# Patient Record
Sex: Male | Born: 1960 | Race: White | Hispanic: No | Marital: Married | Smoking: Current every day smoker
Health system: Southern US, Community
[De-identification: ages and names within clinical notes are randomized; demographics above are authoritative.]

## PROBLEM LIST (undated history)

## (undated) DIAGNOSIS — K759 Inflammatory liver disease, unspecified: Secondary | ICD-10-CM

## (undated) DIAGNOSIS — F32A Depression, unspecified: Secondary | ICD-10-CM

## (undated) DIAGNOSIS — G4730 Sleep apnea, unspecified: Secondary | ICD-10-CM

## (undated) DIAGNOSIS — I10 Essential (primary) hypertension: Secondary | ICD-10-CM

## (undated) DIAGNOSIS — M1990 Unspecified osteoarthritis, unspecified site: Secondary | ICD-10-CM

## (undated) DIAGNOSIS — R918 Other nonspecific abnormal finding of lung field: Secondary | ICD-10-CM

## (undated) DIAGNOSIS — J449 Chronic obstructive pulmonary disease, unspecified: Secondary | ICD-10-CM

## (undated) DIAGNOSIS — B1920 Unspecified viral hepatitis C without hepatic coma: Secondary | ICD-10-CM

## (undated) HISTORY — PX: OTHER SURGICAL HISTORY: SHX169

## (undated) HISTORY — PX: CERVICAL SPINE SURGERY: SHX589

---

## 2003-08-27 ENCOUNTER — Ambulatory Visit (HOSPITAL_COMMUNITY): Admission: RE | Admit: 2003-08-27 | Discharge: 2003-08-28 | Payer: Self-pay | Admitting: Orthopaedic Surgery

## 2003-10-22 ENCOUNTER — Ambulatory Visit (HOSPITAL_COMMUNITY): Admission: RE | Admit: 2003-10-22 | Discharge: 2003-10-22 | Payer: Self-pay | Admitting: Specialist

## 2005-07-27 ENCOUNTER — Encounter: Admission: RE | Admit: 2005-07-27 | Discharge: 2005-07-27 | Payer: Self-pay | Admitting: Neurosurgery

## 2005-09-19 ENCOUNTER — Inpatient Hospital Stay (HOSPITAL_COMMUNITY): Admission: RE | Admit: 2005-09-19 | Discharge: 2005-09-21 | Payer: Self-pay | Admitting: Neurosurgery

## 2005-10-27 ENCOUNTER — Ambulatory Visit: Payer: Self-pay | Admitting: Oncology

## 2005-10-31 ENCOUNTER — Ambulatory Visit: Payer: Self-pay | Admitting: Critical Care Medicine

## 2005-10-31 ENCOUNTER — Inpatient Hospital Stay (HOSPITAL_COMMUNITY): Admission: AD | Admit: 2005-10-31 | Discharge: 2005-11-11 | Payer: Self-pay | Admitting: Thoracic Surgery

## 2005-11-01 ENCOUNTER — Ambulatory Visit: Payer: Self-pay | Admitting: Internal Medicine

## 2005-11-03 ENCOUNTER — Encounter (INDEPENDENT_AMBULATORY_CARE_PROVIDER_SITE_OTHER): Payer: Self-pay | Admitting: Specialist

## 2005-11-15 ENCOUNTER — Encounter: Admission: RE | Admit: 2005-11-15 | Discharge: 2005-11-15 | Payer: Self-pay | Admitting: Thoracic Surgery

## 2005-11-23 ENCOUNTER — Inpatient Hospital Stay (HOSPITAL_COMMUNITY): Admission: EM | Admit: 2005-11-23 | Discharge: 2005-11-27 | Payer: Self-pay | Admitting: Emergency Medicine

## 2005-11-29 ENCOUNTER — Inpatient Hospital Stay (HOSPITAL_COMMUNITY): Admission: EM | Admit: 2005-11-29 | Discharge: 2005-12-03 | Payer: Self-pay | Admitting: Emergency Medicine

## 2005-12-09 ENCOUNTER — Emergency Department (HOSPITAL_COMMUNITY): Admission: EM | Admit: 2005-12-09 | Discharge: 2005-12-09 | Payer: Self-pay | Admitting: Emergency Medicine

## 2005-12-26 ENCOUNTER — Encounter: Payer: Self-pay | Admitting: Internal Medicine

## 2005-12-28 ENCOUNTER — Ambulatory Visit: Payer: Self-pay | Admitting: Internal Medicine

## 2005-12-28 ENCOUNTER — Ambulatory Visit (HOSPITAL_COMMUNITY): Admission: RE | Admit: 2005-12-28 | Discharge: 2005-12-28 | Payer: Self-pay | Admitting: Internal Medicine

## 2005-12-28 LAB — CONVERTED CEMR LAB
ALT: 18 units/L (ref 0–53)
AST: 20 units/L (ref 0–37)
Alkaline Phosphatase: 68 units/L (ref 39–117)
Calcium: 9 mg/dL (ref 8.4–10.5)
Chloride: 106 meq/L (ref 96–112)
Platelets: 354 10*3/uL (ref 152–374)
Potassium: 4.1 meq/L (ref 3.5–5.3)
Sodium: 141 meq/L (ref 135–145)
WBC: 6.9 10*3/uL (ref 3.7–10.0)

## 2006-01-02 ENCOUNTER — Encounter: Admission: RE | Admit: 2006-01-02 | Discharge: 2006-01-02 | Payer: Self-pay | Admitting: Thoracic Surgery

## 2006-02-14 ENCOUNTER — Encounter: Admission: RE | Admit: 2006-02-14 | Discharge: 2006-02-14 | Payer: Self-pay | Admitting: Thoracic Surgery

## 2006-04-05 DIAGNOSIS — B957 Other staphylococcus as the cause of diseases classified elsewhere: Secondary | ICD-10-CM | POA: Insufficient documentation

## 2006-04-05 DIAGNOSIS — J869 Pyothorax without fistula: Secondary | ICD-10-CM | POA: Insufficient documentation

## 2006-04-05 DIAGNOSIS — Z8709 Personal history of other diseases of the respiratory system: Secondary | ICD-10-CM | POA: Insufficient documentation

## 2006-04-09 DIAGNOSIS — F329 Major depressive disorder, single episode, unspecified: Secondary | ICD-10-CM

## 2006-04-09 DIAGNOSIS — IMO0002 Reserved for concepts with insufficient information to code with codable children: Secondary | ICD-10-CM | POA: Insufficient documentation

## 2006-04-09 DIAGNOSIS — F3289 Other specified depressive episodes: Secondary | ICD-10-CM | POA: Insufficient documentation

## 2006-04-09 DIAGNOSIS — B1710 Acute hepatitis C without hepatic coma: Secondary | ICD-10-CM | POA: Insufficient documentation

## 2006-08-14 ENCOUNTER — Ambulatory Visit: Payer: Self-pay | Admitting: Thoracic Surgery

## 2007-01-01 ENCOUNTER — Ambulatory Visit (HOSPITAL_COMMUNITY): Admission: RE | Admit: 2007-01-01 | Discharge: 2007-01-01 | Payer: Self-pay | Admitting: Neurosurgery

## 2007-04-08 ENCOUNTER — Ambulatory Visit: Payer: Self-pay | Admitting: Thoracic Surgery

## 2007-04-08 ENCOUNTER — Ambulatory Visit (HOSPITAL_COMMUNITY): Admission: RE | Admit: 2007-04-08 | Discharge: 2007-04-08 | Payer: Self-pay | Admitting: Neurosurgery

## 2007-04-24 ENCOUNTER — Encounter: Admission: RE | Admit: 2007-04-24 | Discharge: 2007-04-24 | Payer: Self-pay | Admitting: Neurosurgery

## 2007-06-06 ENCOUNTER — Encounter: Admission: RE | Admit: 2007-06-06 | Discharge: 2007-06-06 | Payer: Self-pay | Admitting: Neurosurgery

## 2008-02-27 IMAGING — CR DG CHEST 2V
3 series · 3 of 3 positions shown · non-contrast
Comparison: Portable chest x-ray yesterday.

CLINICAL DATA: Followup left pleural effusion. Left chest tube in place. Severe
dyspnea.

CHEST - 2 VIEW  11/01/2005:

[w chest pa]
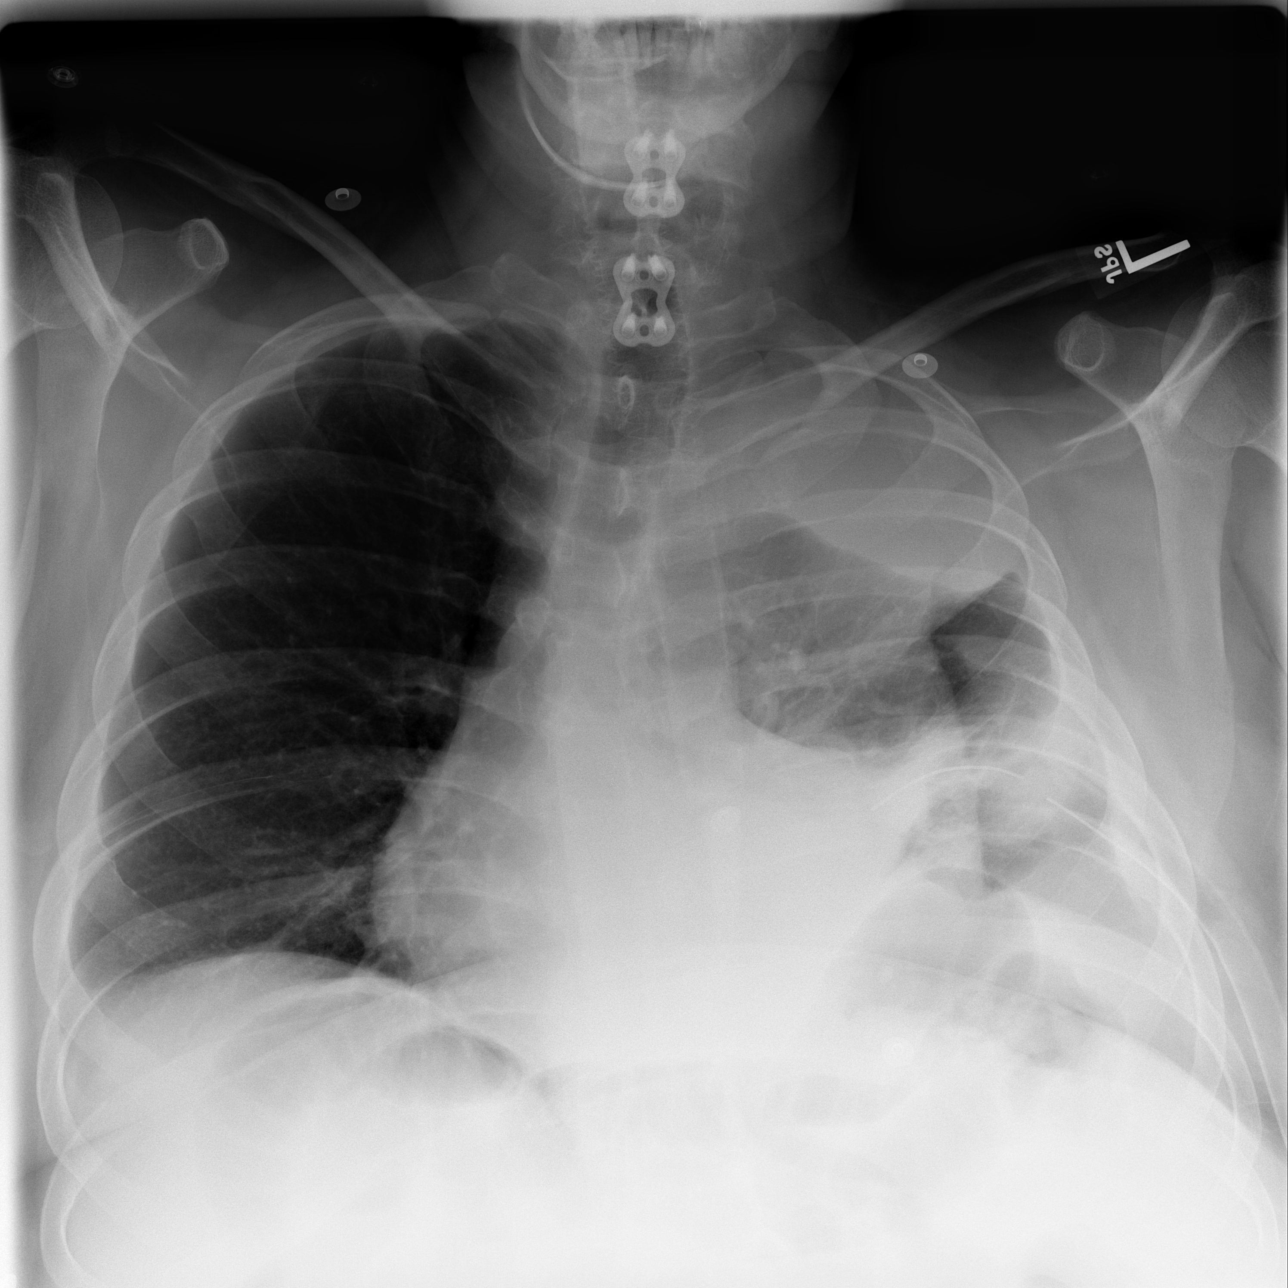

[w chest lat (1 of 2)]
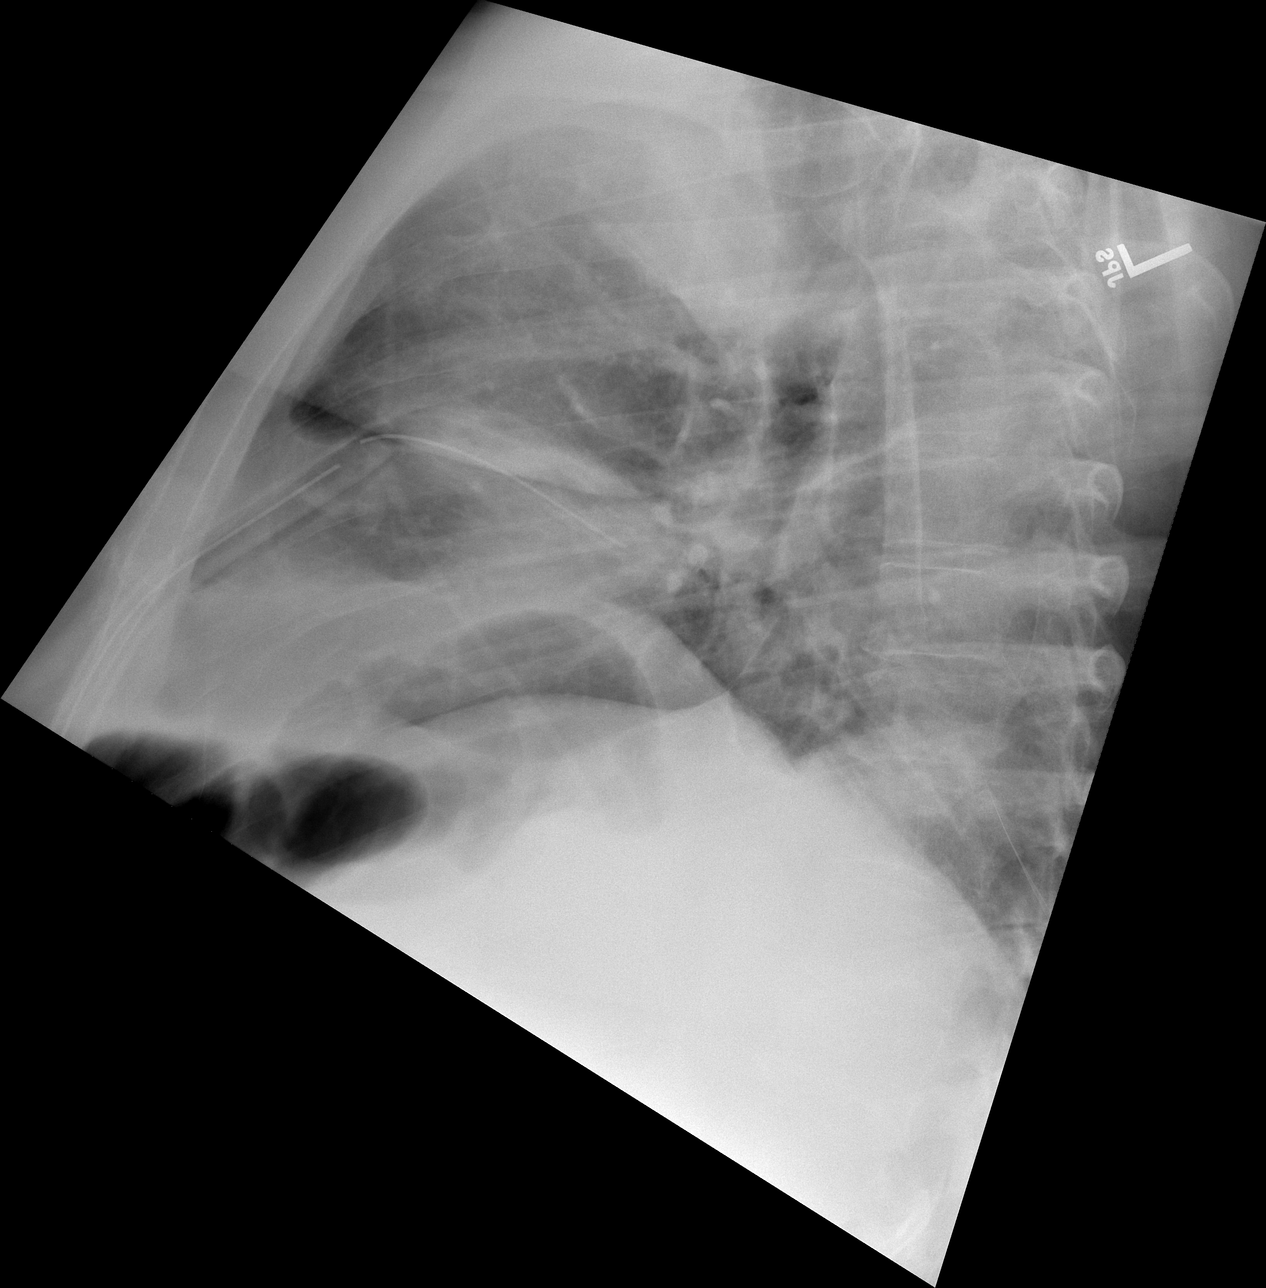

[w chest lat (2 of 2)]
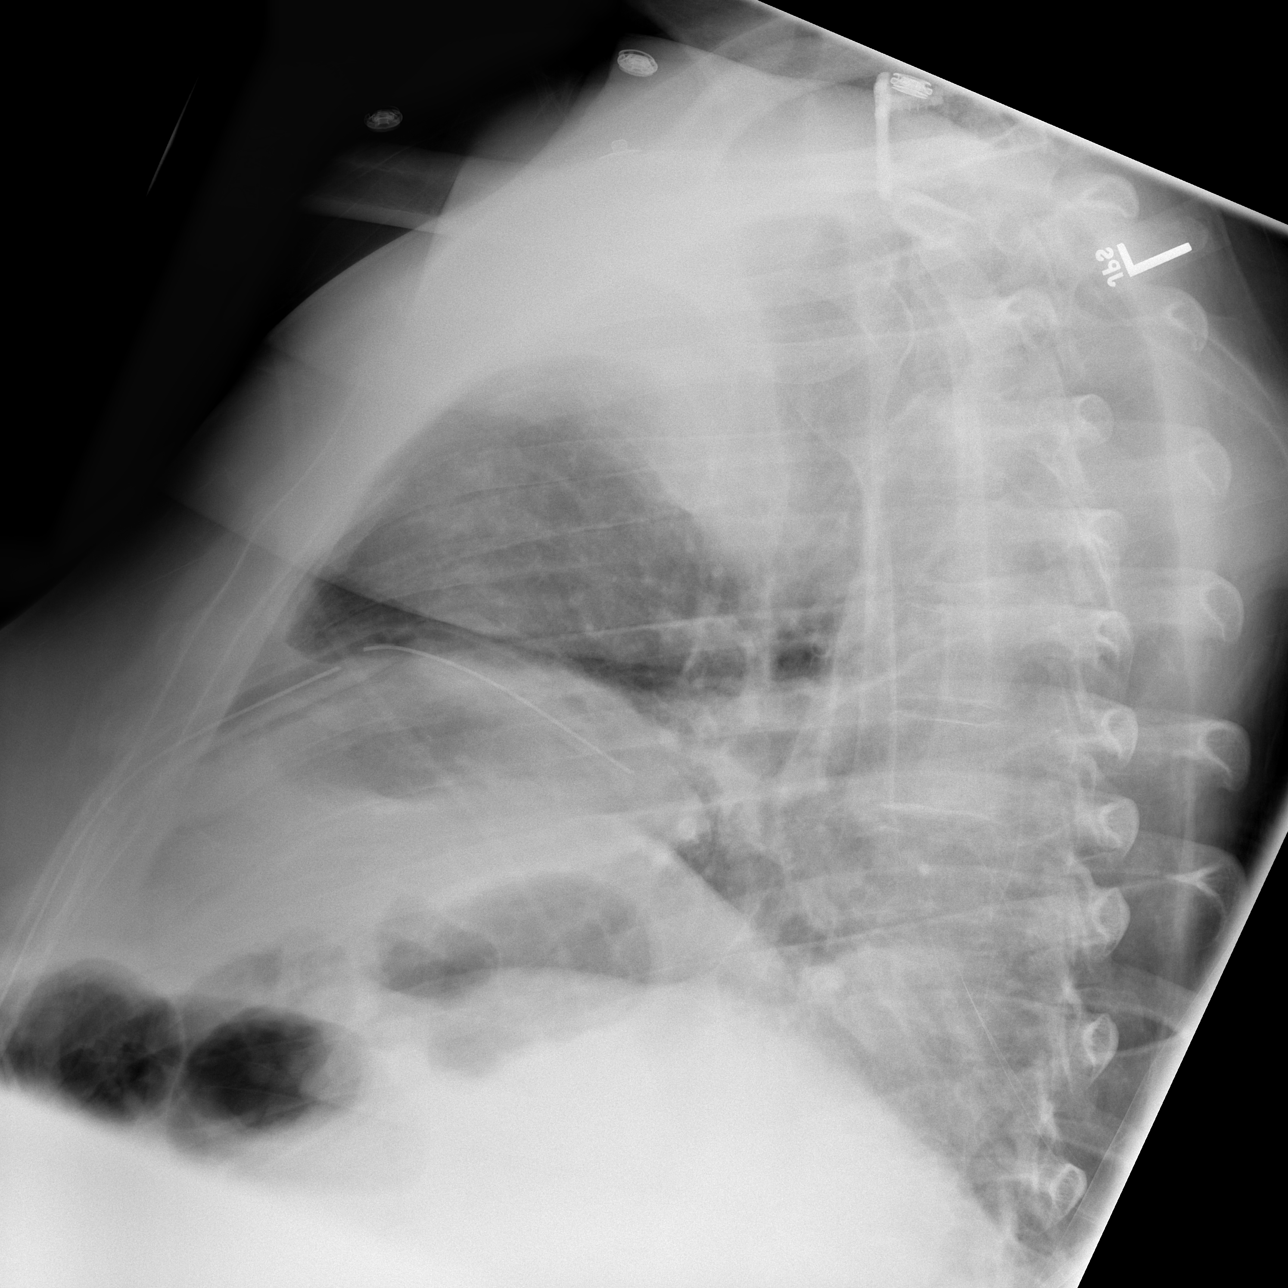

[3 of 3 positions shown; findings below may reference images not displayed]

FINDINGS: The left chest tube is in place in the anterior left chest. There is
no pneumothorax. There is a loculated left pleural effusion in the apex which is
unchanged. There is passive atelectasis and/or pneumonia in the left lung. The
right lung is essentially clear, with only mild atelectasis at the base. The
heart is shifted to the right due to the large left effusion. The heart size is
normal to slightly enlarged.
IMPRESSION: Left chest tube in place in the anterior left lower chest. Stable moderately
large loculated left apical pleural effusion. Stable passive atelectasis or
pneumonia in the left lung. Mild right basilar atelectasis. No new
abnormalities.

## 2008-03-03 IMAGING — CR DG CHEST 1V PORT
1 series · 1 of 1 positions shown · non-contrast
Comparison: 11/05/05.

CLINICAL DATA: Followup pleural effusion. 
 PORTABLE CHEST ? 1 VIEW ? 11/06/05 ? 5535:

[view not recorded]
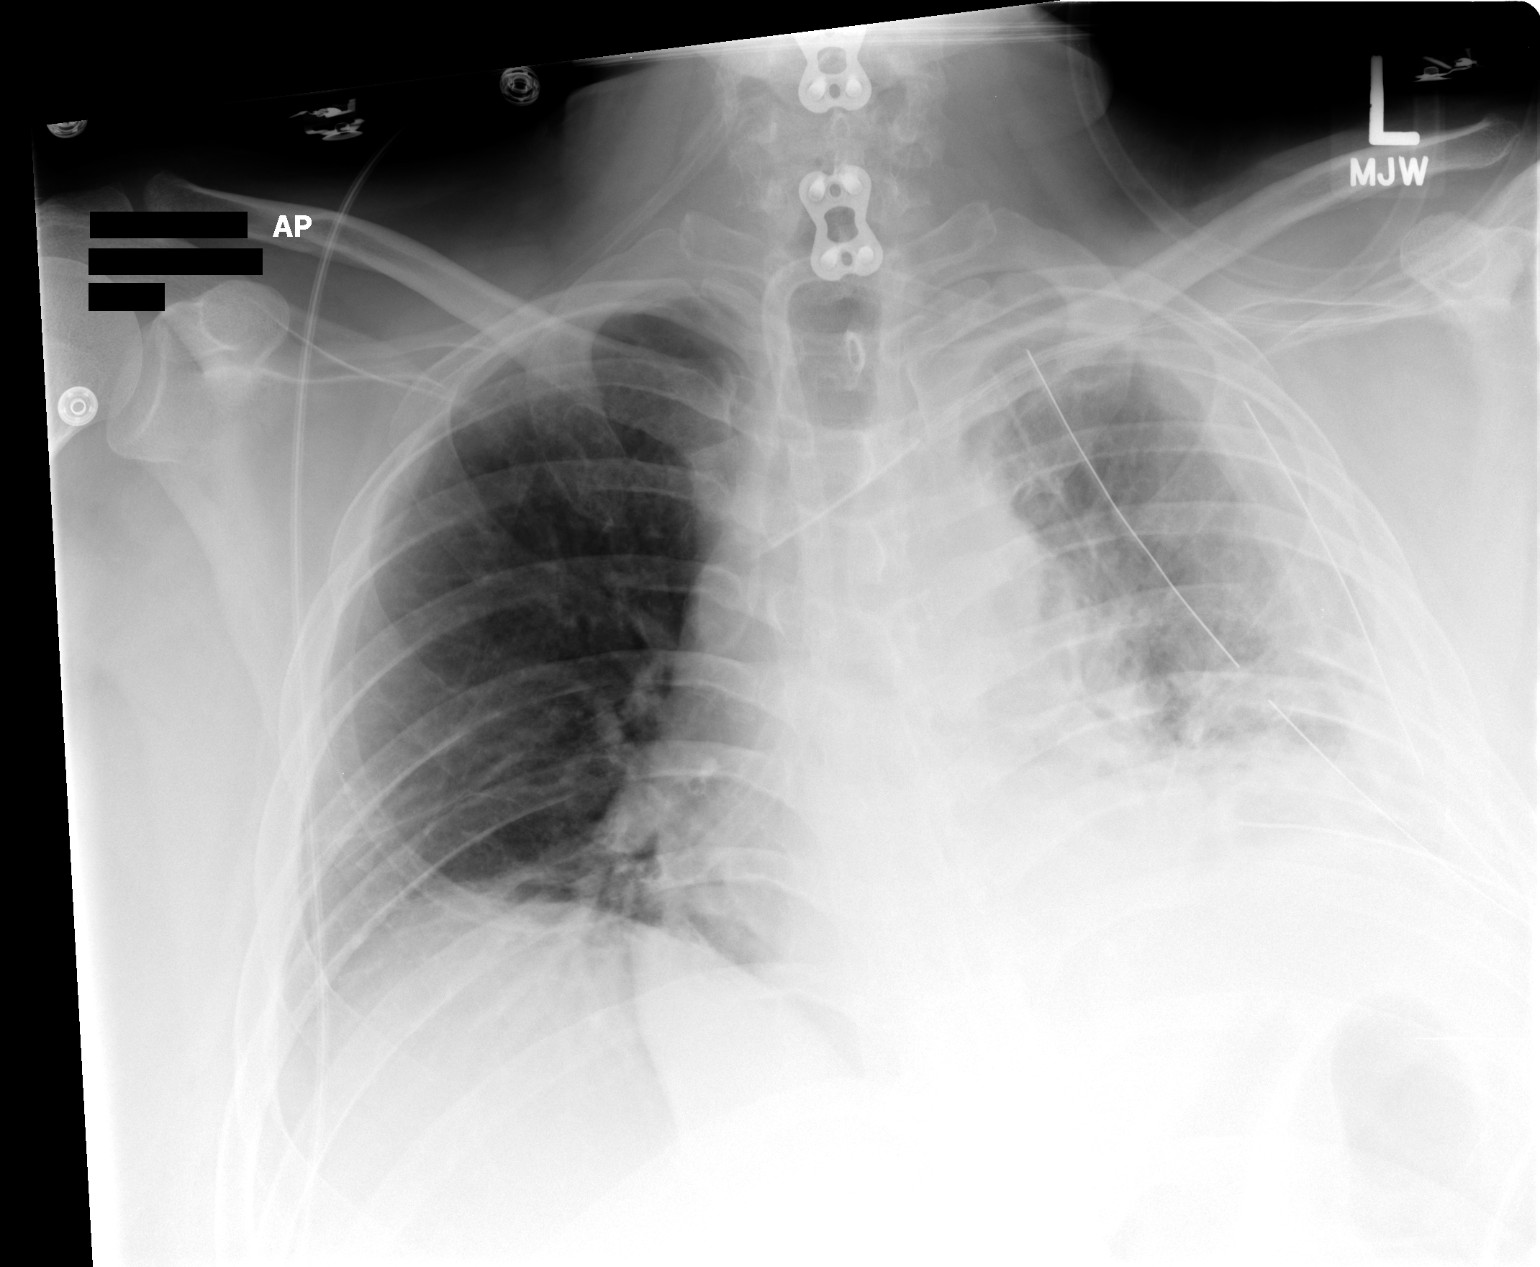

[1 of 1 positions shown; findings below may reference images not displayed]

FINDINGS: Left-sided chest tubes are unchanged in position.  There is stable pleural thickening/loculated left pleural fluid as well as stable bibasilar atelectasis.
IMPRESSION: No significant change in aeration.

## 2008-03-05 IMAGING — CR DG CHEST 1V PORT
1 series · 1 of 1 positions shown · non-contrast
Comparison: Earlier exam today at 8091 hours.

CLINICAL DATA: PICC line placement. 
 PORTABLE CHEST - 1 VIEW ? 11/08/05 (6050 HOURS):

[view not recorded]
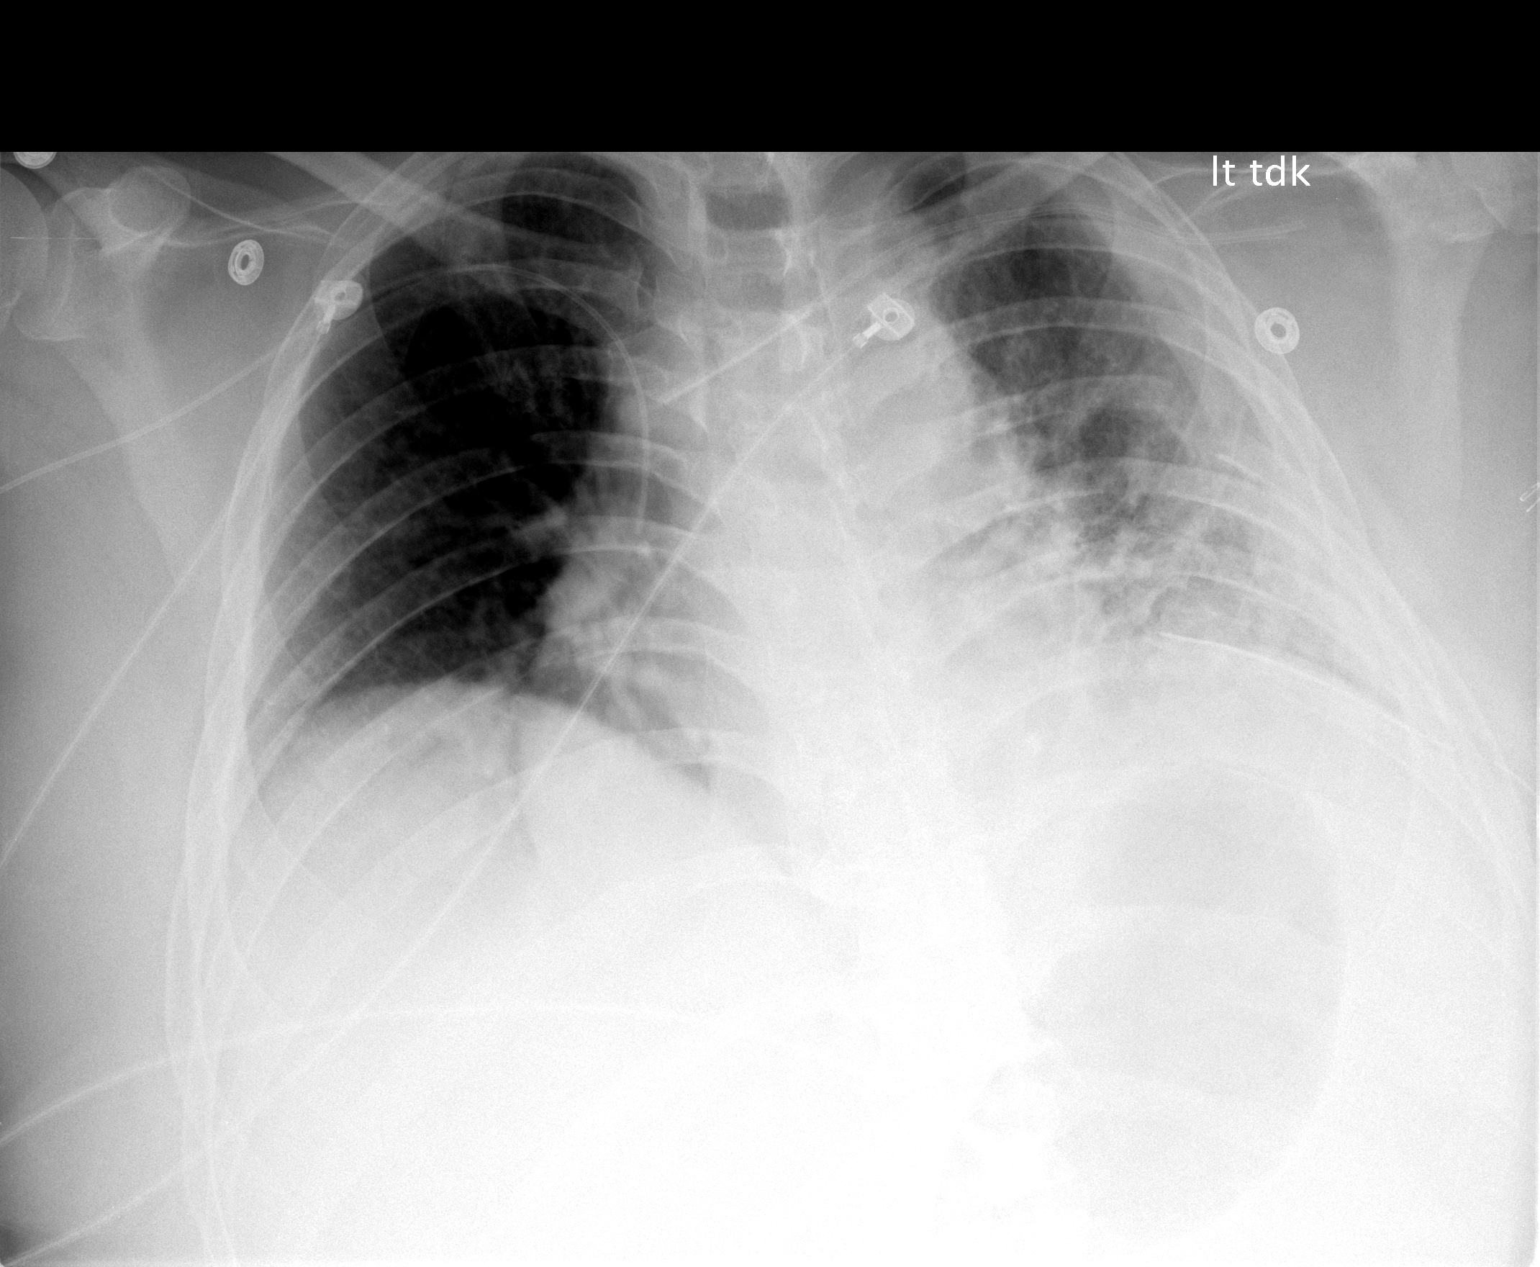

[1 of 1 positions shown; findings below may reference images not displayed]

FINDINGS: Specifically, PICC line has been inserted via right upper extremity approach.  The tip is in the SVC.  Preexisting left subclavian CVC tip is in the proximal SVC.   Minimal improved aeration in the left lung airspace disease, particularly left lower lung zone and left pleural effusion are again noted.  Left pleural chest tube unchanged in position.  No pneumothorax.
IMPRESSION: Specifically, the PICC line tip is in the SVC.  See comments above.

## 2010-02-20 ENCOUNTER — Encounter: Payer: Self-pay | Admitting: Neurosurgery

## 2010-06-14 NOTE — Op Note (Signed)
NAME:  [name], [name] NO.:  [id number]   MEDICAL RECORD NO.:  [id number]          PATIENT TYPE:  OIB   LOCATION:  3524                         FACILITY:  [facility]   PHYSICIAN:  Kathaleen Maser. Pool, M.D.    DATE OF BIRTH:  07-30-1960   DATE OF PROCEDURE:  04/08/2007  DATE OF DISCHARGE:  04/08/2007                               OPERATIVE REPORT   PREOPERATIVE DIAGNOSIS:  Status post C4-5 and C6-7 anterior cervical  diskectomy and fusion, allograft and plating.  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   POSTOPERATIVE DIAGNOSIS:  Status post C4-5 and C6-7 anterior cervical  diskectomy and fusion, allograft and plating.  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   PROCEDURE NOTE:  Re-exploration of C4-5 and C6-7 anterior cervical  fusion with removal of anterior plate instrumentation at C4-5 and C6-7.  C5-6 anterior cervical diskectomy and fusion with allograft and plating.   SURGEON:  Kathaleen Maser. Pool, M.D.   ASSISTANT:  Reinaldo Meeker, M.D.   ANESTHESIA:  General oroendotracheal.   PREMEDICATION:  Mr. Haymore is a 50 year old male who is status post  previous C4-5 and C6-7 anterior cervical diskectomies and fusion by  another physician.  The patient has persistent neck and right upper  extremity pain, paresthesias, and weakness consistent with right-sided  C6 radiculopathy, failing conservative management.  Workup demonstrates  evidence of a right paracentral disk herniation with spinal cord and  exiting C6 nerve root compression.  We discussed options for management  including the possibly undergoing C5-6 anterior cervical diskectomy and  fusion with allograft and plating.  The patient is status post previous  fusions at C4-5 and C6-7.  Fusions on subsequent CT scans appear solid  at both levels.  Plan is for re-exploration of the fusion with removal  of anterior instrumentation at both levels.   OPERATION:  The  patient was placed on the operating table in a supine  position.  After anesthesia was achieved, the patient was positioned  supine with the neck slightly extended, held in place with halter  traction.  The patient's anterior and cervical regions were prepped and  draped sterilely.  A 10 blade was used to make a linear skin incision  overlying the C6 vertebral level.  This was carried down sharply to the  platysma.  The platysma was then divided vertically and dissection  proceeded along the medial border of sternomastoid muscle and carotid  sheath.  Trachea and esophagus were mobilized and retracted towards the  left.  Prevertebral fascia stripped off the anterior spinal column.  Longus coli muscles were then elevated bilaterally using electrocautery.  Deep self-retaining retractor placed, intraoperative fluoroscopy was  used, and the levels were confirmed.  Disk space over the previous  plates at Z6-1 and C6-7 were dissected free.  Ventral removal tools were  then used to remove the screws at both levels.  Both plates were removed  without difficulty.  Fusions at C4-5 and C6-7 were found to be solid.  Disk space at C5-6 was incised with a 15 blade in  rectangular fashion.  A wide disk space clean-out was then achieved using pituitary rongeurs,  forward and backward angled Carlens curettes, Kerrison rongeurs, and the  high-speed drill.  All elements of the disk were removed down to level  of the posterior annulus.  Microscope was brought into the field and  used throughout the remainder of diskectomy.  Remaining aspects of  annulus osteophytes were removed using the high-speed drill down to the  level of the posterior longitudinal ligament.  Posterior longitudinal  ligament was then elevated and resected in piecemeal fashion using  Kerrison rongeurs.  The underlying thecal sac was then identified.  Wide  central decompression was then performed by undercutting the bodies of  C5 and C6.   Decompression then proceeded out each neural foramen.  Wide  anterior foraminotomies were then performed along course of exiting C6  nerve roots bilaterally.  A moderately large amount of free disk  herniation off to the right side at C5-6 was encountered and completely  resected.  At this point, a very thorough decompression had been  achieved.  There is no injury to thecal sac or nerve roots.  Wound was  then irrigated with antibiotic solution.  Gelfoam was placed topically  for hemostasis which found to be good.  A 6-mm LifeNet allograft wedge  was then impacted into place and recessed approximately 1 mL from the  anterior cortical margin of C5-6.  A 30-mm Venture plate was then placed  over the C5 and C6 levels incorporating screw holes at the C5 level from  his previous fusion.  The 13-mm screws were placed at C5 bilaterally.  The 13-mm screws were placed under fluoroscopic guidance at C6.  All  four screws were given a final tightening.  Locking screws engaged at  both levels.  Final images revealed good position of the bone grafts and  hardware at proper level and normal alignment of the spine.  Wound was  then inspected for hemostasis which found be good.  Wound was then  irrigated one final time and then closed in typical fashion.  Steri-  Strips and sterile dressing were applied.  There were no complications.  The patient tolerated the procedure well, and he returned to the  recovery room for postoperative care.           ______________________________  Kathaleen Maser. Pool, M.D.     HAP/MEDQ  D:  04/08/2007  T:  04/09/2007  Job:  [id number]

## 2010-06-17 NOTE — Op Note (Signed)
NAME:  [name], [name]                           ACCOUNT NO.:  [id number]   MEDICAL RECORD NO.:  [id number]                   PATIENT TYPE:  OIB   LOCATION:  NA                                   FACILITY:  [facility]   PHYSICIAN:  Sharolyn Douglas, M.D.                     DATE OF BIRTH:  [birth year]   DATE OF PROCEDURE:  08/27/2003  DATE OF DISCHARGE:                                 OPERATIVE REPORT   PREOPERATIVE DIAGNOSIS:  C6-C7 herniated nucleus pulposus with left upper  extremity radiculopathy.   POSTOPERATIVE DIAGNOSIS:  C6-C7 herniated nucleus pulposus with left upper  extremity radiculopathy.   PROCEDURE:  1. Anterior cervical discectomy C6-C7.  2. Anterior cervical arthrodesis C6-C7 with placement of 9 mm allograft     prosthesis spacer packed with local autogenous bone graft.  3. Anterior cervical plating C6-C7 using the [employer] System.   SURGEON:  Sharolyn Douglas, M.D.   ASSISTANT:  Verlin Fester, P.A.   ANESTHESIA:  General endotracheal anesthesia.   COMPLICATIONS:  None.   INDICATIONS FOR PROCEDURE:  The patient is a very pleasant 50 year old male  with a three month history of severe left upper extremity pain, numbness,  and weakness.  His MRI scan shows a large extruded disc herniation at C6-C7  consistent with his symptomatology.  He has been refractory to all  conservative care and has elected to undergo anterior cervical discectomy  and fusion with allograft and plate in hopes of improving his symptoms.  The  risks, benefits, and alternatives were extensively discussed and the patient  elected to proceed.   PROCEDURE:  The patient was properly identified in the holding area and  taken to the operating room.  He underwent general endotracheal anesthesia  without difficulty.  He was given prophylactic IV antibiotics.  He was  carefully positioned with his neck in slight hyperextension.  The neck was  prepped and draped in the usual sterile fashion.  A 4 cm incision was  made  on the left side of the neck just below the cricoid cartilage in a natural  skin crease.  Dissection was carried sharply through the platysma.  The  interval between the SCM and strap muscles medially was developed down to  the prevertebral space.  The esophagus, trachea, and carotid sheath were  identified and protected at all times.  Because of his large body habitus, a  spinal needle was placed at C4-C5 and C5-C6 to allow visualization on  interoperative x-ray.  We then worked down to the C6-C7 disc space.  The  longus colli muscle was elevated out over the  C6-C7 disc space bilaterally.  A sharp annulotomy was carried out.  Dissection was carried back to the posterior longitudinal ligament.  Caspar  distraction were placed in the C6 and C7 vertebral bodies and gentle  distraction applied.  A rent was identified in the  posterior longitudinal  ligament just to the left of midline.  A large disc herniation was teased  from the subligamentous position using a nerve hook.  We then took down the  posterior longitudinal ligament along the left side and carried this out  into the left foramen.  A second large disc fragment was removed from within  the foramen.  We then explored the spinal canal and neural foramen and found  it to be patent.  Bleeding was controlled with bipolar electrocautery and  Gelfoam.  We then placed a 9 mm allograft prosthesis spacer which had been  packed with local bone graft obtained from the bur shavings.  This was  carefully counter sunk 1 mm.  We then placed a 26 mm anterior cervical plate  with four 13 mm screws.  We insured the locking mechanism engaged.  We had  good screw purchase.  The wound was irrigated.  The esophagus, trachea, and  carotid sheath were examined and there were no apparent injuries.  We left a  deep Penrose drain in place.  The platysma was closed with an interrupted 2-  0 Vicryl, the subcutaneous layer closed with an interrupted 3-0  Vicryl,  followed by a running 4-0 subcuticular Vicryl suture on the skin.  Benzoin  and Steri-Strips were applied.  A sterile dressing was placed.  The patient  was placed into a soft collar, extubated without difficulty, and transferred  to the recovery room in stable condition able to move his upper and lower  extremities.                                               Sharolyn Douglas, M.D.    MC/MEDQ  D:  08/27/2003  T:  08/27/2003  Job:  [id number]
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
## 2010-06-17 NOTE — Discharge Summary (Signed)
NAME:  [name], [name]                 ACCOUNT NO.:  [id number]   MEDICAL RECORD NO.:  [id number]          PATIENT TYPE:  INP   LOCATION:  6736                         FACILITY:  [facility]   PHYSICIAN:  Kela Millin, M.D.DATE OF BIRTH:  Oct 12, 1960   DATE OF ADMISSION:  11/22/2005  DATE OF DISCHARGE:  11/27/2005                               DISCHARGE SUMMARY   DISCHARGE DIAGNOSES:  1. Diarrhea-likely antibiotic associated, Clostridium difficile      negative.  2. Acute renal failure.  3. History of methicillin-resistant Staphylococcus aureus bacteremia.  4. History of pneumonia and empyema/lingular abscess.   CONSULTATIONS:  Infectious disease, Dr. Orvan Falconer.   HISTORY:  The patient is a 50 year old white male who had been recently  discharged from the hospital on November 10, 2005 from Dr. Scheryl Darter  service after having VATS procedure for drainage of a left lingular  empyema and decortication.  He presented with complaints of vomiting as  well as diarrhea and fever which had persisted since his discharge.  He  reported that he had initially been admitted to [facility] on  October 23, 2005 for treatment of chest pain.  He underwent cardiac  evaluation and a CT scan was done that showed an area of the lung which  was nodular.  He underwent a lung biopsy and was discharged.   About one week following the biopsy, the patient woke up short of breath  with having chest pain, passed out and was taken back to [facility] and subsequently transferred to [facility] for further  treatment of lung abscess with empyema.  The patient was initially  treated with Levaquin, vancomycin and Zosyn but since his discharge he  had only been on vancomycin to treat the MRSA which grew from his  cultures.  The patient reported that he had persistent nausea, vomiting  and diarrhea with decreased appetite since his discharge and therefore  came back to the ER.   PHYSICAL  EXAMINATION:  Upon admission, as per Dr. Efraim Kaufmann L. Ladona Ridgel,  revealed a temperature of 101.2 with a blood pressure of 118/75, pulse  of 94, respiratory rate of 20, O2 saturation 96%.  The pertinent  findings on exam on his chest showed decreased left base to midfield  breath sounds, no crackles, no wheezes.  Also decreased breath sounds at  the right base.  The skin over the left chest showed multiple wounds in  various stages of healing.  No gross pus from any of the sites.  The  chest tube remains with a sanguinous material.  The rest of his physical  exam was reported to be within normal limits.   LABORATORY DATA:  His white cell count is 7.6 with a hemoglobin of 11,  hematocrit 32.3, platelet count of 38.3.  Sodium 137, potassium 3.4,  chloride 104, CO2 of 20, BUN 8, creatinine 1.5, glucose of 108.  His  chest x-ray showed a right arm PICC line with the tip in the SVC.  Postsurgical changes involving the left hemithorax with volume loss and  atelectasis in the left base similar to  November 15, 2005.   HOSPITAL COURSE:  Problem 1:  DIARRHEA WITH NAUSEA AND VOMITING/LIKELY  ANTIBIOTIC ASSOCIATED:  Upon admission, the patient was empirically  placed on Flagyl to cover for C. dif and C. dif studies were done x3 and  these were all negative.  The patient was hydrated with IV fluids and  his symptoms improved with this intervention.   Problem 2:  HISTORY OF PNEUMONIA/EMPYEMA/LINGULAR ABSCESS:  Infectious  disease was consulted and saw the patient and noted that the patient was  much better and also that when he was last discharged on November 10, 2005 he was scheduled for an additional two weeks of IV vancomycin for  his MRSA bacteremia, pneumonia and empyema.  He had completed that but  he extended it a little bit longer until the patient's follow up with  him on November 30, 2005.  [facility] was called to  continue other outpatient IV vancomycin until November 30, 2005.   The  patient was afebrile, hemodynamically stable with no leukocytosis upon  discharge.   Problem 3:  ACUTE RENAL FAILURE:  Creatinine was 1.9 upon discharge and  he was to follow up with his primary care physician.   DISCHARGE MEDICATIONS:  The patient to continue IV vancomycin and also  preadmission medications as instructed.   SPECIAL INSTRUCTIONS:  The patient to drink ample fluids and food.   FOLLOWUP CARE:  Dr. Betti Cruz on November 01, 2005 and creatinine as well as  vancomycin level to be done at that time.   CONDITION ON DISCHARGE:  Improved/stable.      Kela Millin, M.D.  Electronically Signed     ACV/MEDQ  D:  02/10/2006  T:  02/10/2006  Job:  [id number]   cc:   Daine Floras, M.D.

## 2010-06-17 NOTE — Op Note (Signed)
NAME:  [name], [name] NO.:  [id number]   MEDICAL RECORD NO.:  [id number]          PATIENT TYPE:  INP   LOCATION:  2314                         FACILITY:  [facility]   PHYSICIAN:  Ines Bloomer, M.D. DATE OF BIRTH:  02/19/1960   DATE OF PROCEDURE:  DATE OF DISCHARGE:                                 OPERATIVE REPORT   PREOPERATIVE DIAGNOSIS:  Left chest empyema.   POSTOPERATIVE DIAGNOSIS:  Left chest empyema.   OPERATIONS PERFORMED:  Left video-assisted thoracoscopic surgery, left  thoracotomy, drainage of empyema, with decortication and resection of left  lingular abscess.   SURGEON:  Ines Bloomer, M.D.   FIRST ASSISTANT:  Constance Holster, PA-C.   DESCRIPTION OF PROCEDURE:  After percutaneous insertion of all monitoring  lines, the patient underwent general anesthesia and was prepped and draped  in the usual sterile manner, turned to the left lateral thoracotomy  position.  The previous chest tubes were removed.  The patient was then  prepped and draped in the usual sterile manner.  A double-lumen tube was  inserted.  A trocar site was inserted in the anterior and posterior axillary  line at the seventh intercostal space; 2 trocars were inserted and a 0-  degree scope was inserted.  The patient obviously had an empyema.  Fluid was  taken for culture, and then we took some exudate for culture.  It was  obvious that we could not do this as a closed procedure and had to have an  open thoracotomy, so a posterolateral thoracotomy was performed over the  fifth intercostal space.  With the latissimus being divided, the serratus  reflected anteriorly, the fifth intercostal space was entered.  A portion of  the sixth rib was resected subperiosteally.  A Finochietto retractor was  inserted.  The first thing we did was take down the lung from the  mediastinum, the pericardium and the lateral chest wall.  After the lung had  been freed up, we then debrided the  exudate.  There was a large amount of  exudate superiorly and inferiorly, both in the left costophrenic angle and  in the left apex.  This purulent exudate was debrided using curets and  multiple irrigations and multiple forceps to remove it completely superiorly  and laterally.  We did a partial pleurectomy to remove all the exudate.  Then, we turned our attention to the cardiophrenic angle, where there was  another abscess, and we removed all the exudate there, finally freeing it  up.  Attention was then turned to the lingula, and there obviously was an  area where there was an open abscess in the lingula.  This was resected with  the EZ45 stapler with several applications and sent for frozen section to  rule out cancer, and it was just inflammatory.  Finally, we did a  decortication of the visceral pleura, removing the exudate from the left  upper lobe and from the left lower lobe using Kitners, DeBakey and Russian  forceps to remove all the exudate.  It was a very purulent type exudate that  was removed until  we could finally reexpand both the left upper lobe and the  lower lobe.  Several areas of tears in the lung were sutured with 3-0  Vicryl.  After this had been done, the whole area was irrigated copiously.  Three chest tubes were placed, 2 through the trocar sites, an anterior and  posterior 36 chest tube, and sutured in place with 0 silk, and then a right-  angle chest tube through a chest tube in between these trocar sites.  And  that was sutured in place with 0 silk.  The previous chest tube site was  closed with 0 silk.  Four holes were placed in the sixth rib by drilling to  place the paracostals.  A single On-Q catheter was inserted above the rib in  the usual fashion and secured in place.  The paracostals were inserted  through the holes and then passed around the superior fifth rib and then  tied down.  The chest was closed in layers with #1 Vicryl, 2-0 Vicryl in the   subcutaneous tissue, and Ethicon skin clips.  The patient returned to the  recovery room in stable condition.           ______________________________  Ines Bloomer, M.D.     DPB/MEDQ  D:  11/03/2005  T:  11/03/2005  Job:  [id number]   cc:   Charlcie Cradle. Delford Field, MD, FCCP

## 2010-06-17 NOTE — H&P (Signed)
NAME:  [name], SEHGAL                 ACCOUNT NO.:  [id number]   MEDICAL RECORD NO.:  [id number]          PATIENT TYPE:  INP   LOCATION:  1824                         FACILITY:  [facility]   PHYSICIAN:  Melissa L. Ladona Ridgel, MD  DATE OF BIRTH:  1960/12/24   DATE OF ADMISSION:  11/23/2005  DATE OF DISCHARGE:                                HISTORY & PHYSICAL   PRIMARY CARE PHYSICIAN:  Dr. Betti Cruz in [place].   CHIEF COMPLAINT:  Vomiting, which is persistent since discharge, as well as  diarrhea and fever.   HISTORY OF PRESENT ILLNESS:  The patient is a 50 year old white male, status  post discharge on November 10, 2005 from Dr. Scheryl Darter service after having a  VATS procedure for drainage of a left lingular empyema and decortication of  an empyema.  The patient evidently was initially admitted to [facility] on October 23, 2005 for treatment of chest pain.  He underwent  heart testing and a CT scan was performed that showed an area of the lung  which was nodular.  The patient underwent lung biopsy and was discharged.  Approximately 1 week after biopsy, the patient woke up short of breath with  chest pain and passed out at home and he was taken back to [facility]  and subsequently transferred to [facility] for further treatment of  lung abscess with empyema.  The patient was initially treated with Levaquin,  vancomycin and Zosyn; since discharge, however, he has only been on  vancomycin to treat methicillin-resistant Staph aureus which grew from his  cultures.  Since discharge, the patient has had persistent nausea, vomiting  and diarrhea with decreased appetite.  He therefore returns today with the  same symptoms and with a fever.   REVIEW OF SYSTEMS:  As per HPI.  He is currently not experiencing any pain.  All other review of systems are negative.   PAST MEDICAL HISTORY:  1. Emphysema.  2. MRSA pneumonia, status post VATS.  3. Ruptured disk in his neck, status post  surgery.   PAST SURGICAL HISTORY:  1. He had neck surgery recently.  2. Fractures of the ankle.  3. He is status post chest tube and VATS procedure.   SOCIAL HISTORY:  The patient quit tobacco in April.  He stated he is not  actively drinking; however, the electronic medical record indicates that he  has polysubstance abuse, previous IV drug use and active ethanol use as of  his last admission.  The patient states he does generally yard work.   FAMILY HISTORY:  Mom is deceased with a history of gangrene and MRSA and dad  is deceased at the age of 54 of questionable MI.   ALLERGIES:  No known drug allergies.   CURRENT MEDICATIONS:  1. Ventolin q.4 h.  2. M.V.I.  3. Oxycodone one to two tablets q.4 h. p.r.n.  4. Promethazine 25 mg q.6 h.  5. Lamictal 100 mg b.i.d., although the patient is only taking once daily.  6. Trazodone 50 mg daily.  7. His home vancomycin dose is listed as per  the discharge summary as 2000      mg IV q.12 h. through November 24, 2005; the patient was being treated      under the [facility] team.   PHYSICAL EXAMINATION:  VITAL SIGNS:  Temperature is 101.2, blood pressure  118/75, pulse is 94, respirations 20, saturation 96%.  GENERAL:  He is in no acute distress.  He is normocephalic, atraumatic.  Pupils are equal, round and reactive to light.  Extraocular muscles are  intact.  Mucous membranes are moist.  CHEST:  Decreased at the left base to mid-field with no rhonchi, rales or  wheezes and decreased at the right base.  The skin over the left chest shows  multiple wounds that are in various stages of healing.  He has no gross pus  from any of the sites.  The site of the chest tube remains with a fibrinous  material.  ABDOMEN:  Obese, nontender and non-distended with normal positive bowel  sounds.  EXTREMITIES:  No clubbing, cyanosis, or edema.  NEUROLOGIC:  He is awake, alert and oriented.  Cranial nerves II-XII are  intact.  Power is 5/5.  DTRs  are 2+.   LABORATORY DATA:  White count is 7.6, hemoglobin is 11, hematocrit is 32.3,  platelets of 38.3.  Sodium is 137, potassium 3.4, chloride is 104, CO2 is  20, BUN is 8, creatinine is 1.5, glucose is 108.   ASSESSMENT AND PLAN:  This is a 50 year old white male, status post  extensive hospital course for treatment of lingular abcess with methicillin-  resistant Staphylococcus aureus.  The patient has had persistent diarrhea  and nausea with vomiting since discharge.  He returns today with nausea,  vomiting, diarrhea and elevated white count.   1. Cardiovascular:  He is hemodynamically stable.  He does not appear to      be septic at this time, but I will monitor him on telemetry with heart      rate and blood pressures.  2. Pulmonary:  A chest x-ray with no significant changes since discharge.      He persists in having left basilar changes consistent with surgery and      there does appear to be a probable effusion.  We will place him on a      flutter valve and continue his metered-dose inhaler and allow the      morning team to determine whether or not he goes back for a CT scan of      the chest to evaluate for reintroduction of a drainage tube.  3. Gastrointestinal -- nausea, vomiting and diarrhea:  We will check blood      cultures, check liver function tests, amylase and lipase.  I will also      sent stool cultures for Clostridium difficile.  The patient recently      was diagnosed with hepatitis C.  There is no treatment at this time for      him.  4. Genitourinary:  We will check a urinalysis and culture and sensitivity      to rule out possible urinary tract infection.  5. Endocrine:  Blood sugars appear to be stable.  6. Infectious disease:  We will continue his vancomycin and cover gram-      negatives with Zosyn until Infectious Disease sees him in the morning.     We will consider, as stated, a chest CT to consider further drainage.  7. For deep venous thrombosis  prophylaxis, he  will get Lovenox.      Melissa L. Ladona Ridgel, MD  Electronically Signed     MLT/MEDQ  D:  11/23/2005  T:  11/23/2005  Job:  [id number]   cc:   Cliffton Asters, M.D.

## 2010-06-17 NOTE — H&P (Signed)
NAME:  [name], SWITZER NO.:  [id number]   MEDICAL RECORD NO.:  [id number]          PATIENT TYPE:  EMS   LOCATION:  MAJO                         FACILITY:  [facility]   PHYSICIAN:  Andres Shad. Rudean Curt, MD     DATE OF BIRTH:  [birth year]   DATE OF ADMISSION:  11/29/2005  DATE OF DISCHARGE:                              HISTORY & PHYSICAL   CHIEF COMPLAINT:  Vomiting and diarrhea.   Mr. Anacker is a 50hyo man recently discharged from [facility].  His recent history is significant for a MRSA lung  abscess that was managed with a VATS procedure followed by a thoracotomy  with resection of the lingula.  He has been managed at home on  intravenous vancomycin as of November 10, 2005.  However, he was admitted  to [facility] on November 23, 2005, with persistent  vomiting and diarrhea.  During this admission, he was found to have an  elevated creatinine of approximately 2.  He received intravenous  hydration which improved his creatinine to 1.7 at discharge.  He was  found to be negative for C. difficile on several stool specimens and he  was clinically improved at the time of discharge.  Laboratory studies  and follow-up with infectious diseases were scheduled for the week  following discharge.  However, over the last 24 hours, the patient has  had worsening diarrhea and persistent vomiting.  He denies fever,  chills, diaphoresis or pain of any sort.  He does have a prescription  for oral Phenergan for nausea but he has not been able to keep that  down.  The emesis has been mostly clear but also bilious at times.   ALLERGIES:  NO KNOWN DRUG ALLERGIES.   HOME MEDICATIONS:  1. Intravenous vancomycin.  2. Lamictal 100 mg b.i.d.  3. Promethazine 25 mg q.6h.  4. Trazodone 50 mg daily.   PAST MEDICAL HISTORY:  1. Patient has a recent diagnosis of emphysema.  2. Lung abscess with lingular resection as described.  3. MRSA colonization.  4.  Hepatitis C.   PAST SURGICAL HISTORY:  1. Patient has had disc surgery.  2. He has had ankle surgery.  3. He has had a thoracotomy with resection of a lingular abscess.   SOCIAL HISTORY:  No history of drug or alcohol abuse.  He is a former  smoker.   PHYSICAL EXAMINATION:  GENERAL APPEARANCE:  Patient was alert, in no  acute distress.  VITAL SIGNS:  Temperature 98.7, pulse 103, blood pressure 131/79,  respiratory rate 20, oxygen saturation 96% on room air.  HEENT:  Moist mucous membranes.  His oropharynx was somewhat  erythematous.  NECK:  No adenopathy, no thyromegaly.  CHEST:  Clear to auscultation bilaterally.  CARDIOVASCULAR:  Regular, normal S1 and S2.  No murmurs, rubs, or  gallops.  ABDOMEN:  Very slow bowel sounds, soft, nontender and nondistended with  no organomegaly.  EXTREMITIES:  Warm and well perfused.  No edema.  Capillary refill time  was under 2 seconds.   LABORATORY DATA:  White blood cell count 9.5, hemoglobin 10.2,  hematocrit 31, platelets 336.  Differential 80% polys, 8% lymphs, 11%  monocytes, 1% eosinophils.  Urinalysis was negative.  Lipase was 21.  Electrolytes with sodium 139, potassium 3.3, chloride 104, bicarb 24,  BUN less than 3, creatinine 2, glucose 101.   ASSESSMENT/PLAN:  This is a 50 year old on long-term home intravenous  vancomycin for treatment of an methicillin resistant Staphylococcus  aureus lung abscess that was also treated surgically.  This is his  second admission for vomiting and diarrhea.  He has persistent elevation  in his creatinine, did not continue to improve following discharge.  1. Vomiting and diarrhea:  The patient will be given normal saline for      hydration and his electrolytes will be monitored.  He will receive      Zofran as an antiemetic.  2. Diarrhea:  The patient was negative for Clostridium difficile      during his last hospitalization but he will be reevaluated during      this admission with, at the  minimum, three stool specimens.  No      antimotility agent will be given.  Additionally, I will send stool      cultures and stool leukocytes.  This may be due to antibiotic      induced diarrhea in the absence of Clostridium difficile colitis.      If so, it is possible that we will be able to stop his parenteral      vancomycin at this point anyway.  He was approaching the end of his      antibiotic course around now and I will discuss this option with      Dr. Orvan Falconer from infectious diseases.  3. Elevated creatinine:  I will send urine studies and monitor his      creatinine following hydration.  4. Methicillin resistant Staphylococcus aureus colonization:  The      patient will be placed on contact precautions.  5. Emphysema:  This is clinically inactive at this time.      Andres Shad. Rudean Curt, MD  Electronically Signed     PML/MEDQ  D:  11/29/2005  T:  11/29/2005  Job:  [id number]   cc:   Cliffton Asters, M.D.  Dr. Betti Cruz

## 2010-06-17 NOTE — Op Note (Signed)
NAME:  [name], [name] NO.:  [id number]   MEDICAL RECORD NO.:  [id number]          PATIENT TYPE:  INP   LOCATION:  3010                         FACILITY:  [facility]   PHYSICIAN:  Hilda Lias, M.D.   DATE OF BIRTH:  Nov 11, 1960   DATE OF PROCEDURE:  09/19/2005  DATE OF DISCHARGE:                                 OPERATIVE REPORT   SURGEON:  Hilda Lias, M.D.   ASSISTANT:  Coletta Memos, M.D.   PREOPERATIVE DIAGNOSES:  1. 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  2. 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   POSTOPERATIVE DIAGNOSES:  1. 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  2. 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   PROCEDURES:  1. Decompression of the spinal cord at the level of C4-C5, interbody      fusion with autograft.  2. Removal of pseudoarthrosis, removal of plate, decompression of C6-C7,      new autograft at that level with a new plate.  3. Microscopy.   CLINICAL HISTORY:  This gentleman had surgery by somebody else at the level  of C6-C7 about 2 years ago.  The patient had been complaining of a lot of  neck pain, which gets worse with moving.  On top of that, he is complaining  of pain going to the right shoulder.  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  The patient has not improved with conservative treatment  and surgery was advised.  The patient new that because of the nonunion we  were going to use bone from the right iliac crest.   DESCRIPTION OF PROCEDURE:  The patient was taken to the OR, where,e after  intubation, the neck was prepared with DuraPrep.  A longitudinal incision  was made through the skin, subcutaneous tissue and platysma, down to the  cervical spine.  We dissected until we found the previous plate.  The plate  was removed without any problem.  Indeed, at this level, there was  nonunion.  There was fibrous tissue.  Incision was made using the knife, as well as the  drill, and we were able to take down the fibrotic tissue.  Decompression of  the cord, as well as foraminotomy were achieved.  The same procedure was  done at the level of C4-C5, where we found 2 fragments of disk going to the  right side on the native posterior ligament.  Having good decompression, we  went straight down to the iliac crest, which was prepped previously.  A  longitudinal incision through the skin, subcutaneous tissue, down to the  iliac crest was done.  Two pieces of bone graft, one 10 mm and the other one  12 mm, were removed.  The bone site was packed with Gelfoam.  It was left  open, although protected while we proceeded with the surgery in the neck.  At the level of C4-C5, after we drilled the endplate, we  introduced the bone  graft without any problem.  This was followed by a plate using 4 screws.  At  the level of C6-C7, the bone graft was tailored down to about 8 mm.  It was  inserted without any problem.  Again, a plate with 4 screws was used to  secure the bone graft in place.  Having done this, the area was irrigated.  While we waited to see how good the hemostasis was in the neck, we proceeded  with closing the wound of the iliac crest.  For that, we used 0 Vicryl for  the fascia, 2-0 for the subcutaneous space  and Steri-Strips to the skin.  We went back to the neck, and there was good  hemostasis.  There was no need for and we decided not to put in any drain.  Then, the platysma was closed with a 3-0 Vicryl, the subcutaneous space with  the same, and the skin with nylon.  The patient did well and he is going to  go to the PACU.           ______________________________  Hilda Lias, M.D.     EB/MEDQ  D:  09/19/2005  T:  09/20/2005  Job:  [id number]

## 2010-06-17 NOTE — Consult Note (Signed)
NAME:  [name], [name] NO.:  [id number]   MEDICAL RECORD NO.:  [id number]          PATIENT TYPE:  INP   LOCATION:  5711                         FACILITY:  [facility]   PHYSICIAN:  Antonietta Breach, M.D.  DATE OF BIRTH:  Jun 21, 1960   DATE OF CONSULTATION:  12/02/2005  DATE OF DISCHARGE:                                   CONSULTATION   REQUESTING PHYSICIAN:  Dr. Doristine Counter.   REASON FOR CONSULTATION:  Depression.   HISTORY OF PRESENT ILLNESS:  [name] is a 50 year old married male,  admitted to the [facility] on the 31st of October, 2007, with  vomiting and diarrhea.   The patient has had a severe problem with an MRSA lung abscess that was  managed with the VATS procedure followed by a thoracotomy with resection of  the lingula.   He was managed at home with intravenous vancomycin.  He had to be readmitted  on the 25th of October with persistent vomiting and diarrhea.  He had  developed increased creatinine to 2.  His intravenous hydration helped and  his creatinine decreased to 1.7.  He was able to be discharged; however,  just prior to admission, he began to develop diarrhea and persistent  vomiting.   [name] mother died 6 months prior and he has begun to experience a  number of weeks of very easy crying, decreased energy, decreased  concentration.  He has not had any suicidal thoughts.  He has not had any  thoughts of harming others.  He has not had delusions or hallucinations.  He  has been taking his Lamictal 100 mg q. day for mood stabilization without  adverse effect.  He is socially appropriate and cooperative with care.  He  does not have any memory or orientation deficits.   PAST PSYCHIATRIC HISTORY:  [name] does have a history of mood swings.  He has had periods where his energy will go up, his thought speed will go  up, and he will have intense anger.  He has never hit his wife; however, he  has been in fights.  He has never  attempted suicide, but has had several  depressive periods with thoughts of suicide.   He was admitted to the [facility] unit a number of years  ago.  He entered voluntarily.  It is unclear whether that was for a  depression or a manic period.   The patient has no history of hallucinations or delusions.  He has been  tried on more than Lamictal in the past.  He was placed on Celexa prior to  Lamictal; however, he continued to have the high energy rage periods.  They  did not worsen with Celexa, but he continued to also have sexual side  effects with Celexa.  His primary care physician then switched him over to  Lamictal.  He has not been followed by an outpatient psychiatrist.  After  going on the Lamictal, the patient's rage resolved.  However, he reports a  period over the past 6 months where he resumed  alcohol on 2 occasions.  He  has not had any alcohol in 2 months.  Please see the history below.   The patient has never had any psychotherapy.  He does mention some periods  of worry without significant anxiety symptoms.   He has a significant chemical dependency history.  He reports having smoked  crack for several years as well as drinking consistently excessive alcohol.  He states that he did it enough to become sick and tired of being sick and  tired.  He reports that his drug habits kept him on the street for 4 years  at one time.  Eventually, he developed the resolve to quit it all without  rehabilitation or 12-step groups.  He remained abstinent from alcohol and  illegal drugs for 6 years, until his mother passed away 6 months ago, and  then he relapsed on alcohol twice over that period.  He has not had any  alcohol in 2 months.   FAMILY PSYCHIATRIC HISTORY:  The patient has brothers who are continuing in  the crack habit.   SOCIAL HISTORY:  [name] was born in [place].  Siblings include 4  sisters and 2 brothers.  The patient was educated through the  6th grade and  then began to work, Producer, television/film/video various trades through [employer] without any formal  schooling.  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  He has  3 children by a previous common law arrangement.  He is now married to a  second lady.  They have no children together; however, they have a mutually  supportive relationship without abuse.  The patient remains in contact with  the mother of his children and the children; however, they do live in a  different town.  He is currently not working, due to his medical disability.  His wife works regularly.   GENERAL MEDICAL PROBLEMS:  1. Emphysema.  2. Lung abscess with lingular resection, MRSA colonization.  3. Hepatitis C.   PAST SURGICAL HISTORY:  Surgical history includes:  1. Disk surgery.  2. Ankle surgery.  3. Thoracotomy with resection of a lingular abscess.   MEDICATIONS:  The MAR is reviewed.  Psychotropics currently include:  1. Lamictal 100 mg b.i.d.  2. Desyrel 50 mg q.h.s.   ALLERGIES:  THE PATIENT HAS NO KNOWN DRUG ALLERGIES.   LABORATORY DATA:  White blood cell count 5.4, hemoglobin 9, platelets 340.  Metabolic panel is remarkable for a BUN of 3, creatinine of 2, SGOT 15, SGPT  is 9, albumin 2.1, calcium 8.1.   REVIEW OF SYSTEMS:  CONSTITUTIONAL:  Afebrile.  HEAD:  No trauma.  EYES:  No  visual changes.  EARS:  No hearing impairment.  NOSE:  No rhinorrhea.  MOUTH/THROAT:  No sore throat.  NEUROLOGICAL:  Unremarkable.  PSYCHIATRIC:  As above.  CARDIOVASCULAR:  No chest pain, palpitations, or edema.  RESPIRATORY:  As above.  GASTROINTESTINAL:  The patient is not currently  vomiting.  GENITOURINARY:  No dysuria.  SKIN:  Unremarkable.  MUSCULOSKELETAL:  No deformities.  ENDOCRINE/METABOLIC:  Unremarkable.  HEMATOLOGIC/LYMPHATIC:  As above.   PHYSICAL EXAMINATION:  VITAL SIGNS:  Temperature 98.6, pulse 101, respirations 20, blood pressure 119/61.  O2 saturation on room air 98%.  MENTAL STATUS EXAM:  Mr.  Schmidt is alert.  His psychomotor tone is mildly  decreased, reflecting his lower energy.  His fund of knowledge and  intelligence are within normal limits.  He has a depressed mood.  He  is  oriented to all spheres.  His memory is intact to immediate, recent, and  remote.  His speech involves normal rate and prosody.  Thought process:  Logical, coherent, and goal-directed.  No looseness of associations.  Thought content:  No thoughts of harming himself, no thoughts of harming  others, no delusions, no hallucinations.  Concentration is mildly decreased.  Affect is constricted.  Insight is fairly good.  Judgment is intact.  His  eye contact is good.  He is socially appropriate.   ASSESSMENT:  AXIS I:  Mood disorder, not otherwise specified, 293.83,  depressed (this category refers to the general medical as well as functional  factors involved, including a process of bereavement.)  A.  Bipolar disorder, not otherwise specified.  B.  Polysubstance dependence, stable.  AXIS II:  Deferred.  AXIS III:  See general medical problems.  AXIS IV:  General medical and bereavement.  AXIS V:  55.   [name] is not at risk to harm himself or others.  He does agree to use  immediately emergency services for any psychiatric emergency symptoms,  including thoughts of harming himself, thoughts of harming others, or  distress.   The indications, alternatives, and adverse effects of the following were  discussed with the patient:  Lamictal, Wellbutrin versus Celexa, trazodone.   The patient understands the above information and would like to proceed with  a Wellbutrin trial for antidepression.   RECOMMENDATIONS:  1. Would start Wellbutrin at 100 mg SR p.o. q. a.m.  The patient is aware      of its risk for mania as well as seizures and wants to proceed,      particularly given its relative decreased risk for sexual dysfunction.      Would continue 100 mg SR q. a.m. for 3 days, and if tolerated,  would      then increase to 100 mg SR p.o. q. a.m. and 4 p.m. if the second dosage      is taken too late sometimes it can induce insomnia.  2. Would not change his other psychotropic medications.  3. Ego supportive therapy and education was provided.  This is to be      continued.  4. Preliminary discharge plan:  It was recommended to the patient that he      have psychiatric followup and 12-step groups following discharge.      However, he has been very satisfied with his abstinence program that he      has done with himself personally.  Also, he is very comfortable with      the relationship with his primary care physician and would prefer that      he follow up with his primary care physician.  He does note that he and      his primary care physician have agreed that if anything arises      psychiatrically that is not well-controlled, that they will consult an      outpatient psychiatrist. 5. Would ask the case manager to request that the patient release this      consult and any followup notes to the outpatient physician and schedule      the patient with the outpatient M.D. who will be managing the      psychotropics within the first week of discharge.      Antonietta Breach, M.D.  Electronically Signed     JW/MEDQ  D:  12/02/2005  T:  12/03/2005  Job:  [id number]

## 2010-06-17 NOTE — Discharge Summary (Signed)
NAME:  [name], [name] NO.:  [id number]   MEDICAL RECORD NO.:  [id number]          PATIENT TYPE:  INP   LOCATION:  5711                         FACILITY:  [facility]   PHYSICIAN:  Hollice Espy, M.D.DATE OF BIRTH:  17-Feb-1960   DATE OF ADMISSION:  11/29/2005  DATE OF DISCHARGE:  12/03/2005                                 DISCHARGE SUMMARY   CONSULTANTS:  Cliffton Asters, M.D., Infectious Disease.   DISCHARGE DIAGNOSES:  1. Nausea, vomiting, and diarrhea felt to be secondary to antibiotic      related side effects.  2. Chronic renal insufficiency with acute renal failure secondary to      dehydration, diarrhea, nausea, and vomiting, now at baseline.  3. History of methicillin-resistant Staphylococcus aureus lung abscess,      status post video-assisted thoracic surgery procedure and thoracotomy.  4. History of hepatitis C.  5. Depression.   DISCHARGE MEDICATIONS:  The patient will be stopping IV vancomycin.  He will  be discharged on:  1. Lamictal 100 mg p.o. b.i.d.  2. Trazodone 50 mg p.o. daily.  3. Wellbutrin 100 mg SR daily.   HOSPITAL COURSE:  The patient is a 50 year old white male with a recent  history of MRSA lung abscess status post a VATS procedure.  He was placed on  IV vancomycin, which he had been receiving through a PICC line at home.  He  continued to have problems with nausea and vomiting and could not anymore  and came into the emergency room for further evaluation.  At the time of  admission, he was noted by my partner to be depressed and overwhelmed with  all these factors and was tearful.  Infectious Disease was consulted for the  patient's previous lung abscess and recommendation of continued antibiotics  as well as evaluation of further infection.  In addition, Dr. Jeanie Sewer,  from Psychiatry, was consulted for help with the management of the patient's  depression.   Hospital course regarding the patient's nausea and vomiting.  Repeat  stool  cultures were sent and found to be negative for C. diff.  Dr. Orvan Falconer  recommended then that the patient have all antibiotics stopped as he had  been put on prophylactic Flagyl for a possible C. diff and recommended  continuing to monitor the patient.  This was stopped on the evening of  November 1st.  By November 3rd, the patient was feeling better and able to  tolerate p.o.  Plan is to watch him for 1 more day and then put him on p.o.  doxycycline 100 mg twice a day.  If he is able to tolerate, the plan will be  to continue this for 4 weeks on p.o. outpatient.  If he unable to tolerate,  the plan will be to discharge him home without any type of antibiotic.  At  this time, his diarrhea has since stopped.  In regards to patient's  depression, Dr. Jeanie Sewer evaluated the patient and recommended that he get  started on Wellbutrin 100 mg p.o. daily SR.  He also recommended outpatient  follow up for  the patient with counseling.  In regards to the patient's  renal insufficiency, the patient's creatinine had bumped to as high as 2.3  following IV hydration and cessation of nausea, vomiting.  His creatinine  has started to trend down.  The plan will be to recheck in the morning prior  to discharge.   DISPOSITION:  If improved, activity will be as tolerated.   DISCHARGE DIET:  A regular diet.   FOLLOWUP:  He will follow up with his PCP, Dr. Betti Cruz, in the next several  weeks.      Hollice Espy, M.D.  Electronically Signed     SKK/MEDQ  D:  12/02/2005  T:  12/03/2005  Job:  [id number]

## 2010-06-17 NOTE — Consult Note (Signed)
NAME:  [name], [name] NO.:  [id number]   MEDICAL RECORD NO.:  [id number]          PATIENT TYPE:  INP   LOCATION:  3302                         FACILITY:  [facility]   PHYSICIAN:  Charlcie Cradle. Delford Field, MD, FCCPDATE OF BIRTH:  [birth year]   DATE OF CONSULTATION:  DATE OF DISCHARGE:                                   CONSULTATION   DATE OF CONSULTATION:  November 01, 2005   CHIEF COMPLAINT:  Empyema.   HISTORY OF PRESENT ILLNESS:  A 50 year old white male who initially had  anterior C-spine surgery in August of 2007.  For this, he has had chronic  pain, for which he has been on oxycodone.  He has also had a previous  history of anxiety on chronic Valium.  The patient then presented to the  emergency room on October 23, 2005, in [place], found to have left-sided  pleuritic chest pain, found to have density on the CT scan, 2 x 2 cm in  diameter, left upper pleural based process.  On the same day, he is referred  to radiology for needle biopsy.  This occurred on the same day.  He was also  found to have a right middle lobe infiltrate, as well, on the same CT scan.  Biopsy showed scarring and fibrosis.  He then came back to the emergency  room on October 28, 2005, with progressive shortness of breath, fatigue,  and fever.  He was admitted at that time with near total opacification of  the left lung.  CT scan showed multiloculated pleural effusion.  He had  respiratory distress, fever, and sepsis documented.  At that point, blood  cultures were positive for MRSA and also after chest tube was placed on the  same date, a positive culture for MRSA was obtained there, as well.  As the  patient has failed to clear this pleural-based process, he is referred now  to [facility] for further intervention.  He was transferred on October 31, 2005, to Dr. Karle Plumber and we are consulted on November 01, 2005, for  further assistance.  He was seen by hospitalists, pulmonary and  general  surgery in [place] before transfer.  The patient has a previous history of  chronic MRSA skin infections and is morbidly obese.  He is not currently  smoking, but did previously smoke for many years,  quitting several years  ago.  He had IV drug abuse, but quit doing this 15 years ago.  He is  referred for further evaluation.   PAST MEDICAL HISTORY:  As noted above.  Also ankle fracture in the past with  fracture repair of the ankle is noted, previous chronic drug use with Valium  and oxycodone, no alcohol use, chronic neck pain with recent neck surgery  per Dr. Jeral Fruit,  chronic MRSA skin infections as noted, and then the more  recent issues as noted above.  Also a history of vasovagal attacks, previous  history of ethanol abuse, but not currently using alcohol.   MEDICATION ALLERGIES:  NONE.   CURRENT MEDICATIONS PRIOR TO TRANSFER:  Included  Levaquin, vancomycin, and  Zosyn for the past 4 days.  Chronic pain medicine, as well.  Medication  profile also includes albuterol and Atrovent q.6, IV Protonix, Lamictal 100  mg b.i.d., nicotine patch, Solu-Medrol 40 IV q.8, Lovenox 40 mg daily.   SOCIAL HISTORY:  As noted above.   FAMILY HISTORY:  Noncontributory.   PHYSICAL EXAMINATION:  GENERAL:  This is an obese male on 50% Venturi mask,  laying in the bed in no acute distress.  VITALS:  Temperature 97, blood pressure 116/65, pulse 89, respirations 22,  saturation 92% on 50% Venturi mask.  CHEST:  Showed decreased breath sounds, dullness to percussion three  quarters of the way up on the left, clear on the right.  CARDIAC:  Showed a regular rate and rhythm without S3.  Normal S1 and S2.  There was no murmur.  ABDOMEN:  Protuberant, bowel sounds active.  EXTREMITIES:  Showed no edema or clubbing or venous disease.  Skin was  clear.  There were no new active folliculitis type lesions seen.  Extremities were intact.  There was no edema, no clubbing noted.   LABORATORY DATA  PRIOR TO TRANSFER:  Blood cultures and pleural space  cultures on October 28, 2005, are positive for MRSA.  The pH 7.44, pCO2  38, pO2 47 on 55% Venturi mask on October 31, 2005.  White count on October 30, 2005, 21,000, hemoglobin 11.5, platelet count was 316,000.  There was a  left shift.  Sodium 135, potassium 4.1, chloride 103, CO2 24, BUN 18,  creatinine 0.8, sugar 125, troponin 0.01.  Drug screen on admission was  positive for benzodiazepines.  PT 12.7, INR 1.3.  CT scan did show densities  in the left lung with near total opacification of the left lung.   IMPRESSION:  1. Multiloculated left pleural empyema secondary to methicillin-resistant      Staphylococcus aureus with previous nodular infiltrate right middle      lobe and nodular pleural-based mass left upper lobe with needle biopsy      October 23, 2005.  I expect in this case, he had a complication from      the needle biopsy, resulting in pleural fluid and possible hemothorax,      resulting in now the empyema.  I wonder if he did not have methicillin-      resistant Staphylococcus aureus pulmonary infection prior to this      needle biopsy having occurred.  He now has atelectasis and pneumonia      left lung.  2. Hypoxic respiratory failure, acute.  3. Morbid obesity.  4. Nonresolving drainage from pleural space with left chest tube.  5. History of Valium and oxycodone use for anxiety and chronic pain with      previous C-spine surgery August 2007 per Dr. Jeral Fruit.  6. Previous history of intravenous drug abuse and ethanol abuse, although      no recent documentation for this.  7. Morbid obesity.  8. Depression anxiety.  9. 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   RECOMMENDATIONS:  Agree the patient needs video-assisted thoracoscopic  surgery.  Needs ID consultation, we called this in.  Continue IV vancomycin. Give all due consideration to IV Zyvox.  Need to have a workup obtained for   systemic seeding of MRSA, including potential osteomyelitis, neck infection,  bacterial endocarditis on the valves.  Will follow with you.  The patient  will be a complex patient postop.  Will likely require vent support in  the  ICU postop.      Charlcie Cradle Delford Field, MD, [facility]  Electronically Signed     PEW/MEDQ  D:  11/01/2005  T:  11/01/2005  Job:  [id number]   cc:   Ines Bloomer, M.D.  Gwendlyn Deutscher II, M.D.

## 2010-06-17 NOTE — H&P (Signed)
NAME:  MEREDITH, MELLS NO.:  [id number]   MEDICAL RECORD NO.:  [id number]          PATIENT TYPE:  INP   LOCATION:  2899                         FACILITY:  [facility]   PHYSICIAN:  Hilda Lias, M.D.   DATE OF BIRTH:  10/16/1960   DATE OF ADMISSION:  09/19/2005  DATE OF DISCHARGE:                                HISTORY & PHYSICAL   Mr. Duval is a gentleman who in the past underwent fusion of the level C6-  C7, but he does not remember who did his neck surgery.  Nevertheless, he had  been complaining of neck pain with radiation to the right upper extremity  associated with weakness.  Any type of pulling to the neck produces the  pain.  He also complained of some pain mostly going to the left shoulder.  The patient had conservative treatment, he is not any better.  He wants to  proceed with surgery.   PAST MEDICAL HISTORY:  Neck surgery, shoulder surgery.  He is not allergic  to any medication.  The patient does not smoke, does not drink.   FAMILY HISTORY:  Negative.   REVIEW OF SYSTEMS:  Positive for neck pain.   PHYSICAL EXAMINATION:  NECK:  There is a scar anteriorly on the left side.  He is able to flex but extension and lateralization produces discomfort  going to the shoulder.  LUNGS:  Clear.  HEART SOUNDS:  Normal.  ABDOMEN:  Normal.  EXTREMITIES:  Normal pulses.  NEUROLOGIC:  He showed weakening of the right deltoid.  Coordination and  gait normal.   The cervical spine showed that he has a herniated disc at the level of C4-C5  and pseudoarthrosis at the level C6-C7.   CLINICAL IMPRESSION:  1. C4-C5 herniated disc.  2. Pseudoarthrosis C6-C7.   RECOMMENDATIONS:  The patient is being admitted for surgery.  The procedure  will be a C4-C5 discectomy and fusion.  We are going to redo the fusion at  the level C6-C7 using iliac crest.  He knows about the risks such as  infection, damage to the vocal cord, damage to the esophagus, damage to the  trachea,  damage to the vertebral artery, need of further surgery, no  improvement whatsoever, and also the pain and risks associated with the bone  graft in hip.           ______________________________  Hilda Lias, M.D.    EB/MEDQ  D:  09/19/2005  T:  09/19/2005  Job:  [id number]

## 2010-10-24 LAB — CBC
HCT: 48
Hemoglobin: 16.4
RBC: 5.48
RDW: 14.1

## 2010-10-24 LAB — BASIC METABOLIC PANEL
BUN: 11
Calcium: 9.4
Chloride: 105
GFR calc non Af Amer: >60
Glucose, Bld: 75

## 2010-10-24 LAB — DIFFERENTIAL
Basophils Relative: 0
Eosinophils Absolute: 0.2
Eosinophils Relative: 2
Lymphocytes Relative: 19
Neutro Abs: 7.8 — ABNORMAL HIGH

## 2010-10-24 LAB — TYPE AND SCREEN: ABO/RH(D): A NEG

## 2021-01-30 DIAGNOSIS — K219 Gastro-esophageal reflux disease without esophagitis: Secondary | ICD-10-CM | POA: Insufficient documentation

## 2021-01-30 DIAGNOSIS — I714 Abdominal aortic aneurysm, without rupture, unspecified: Secondary | ICD-10-CM | POA: Insufficient documentation

## 2021-01-30 DIAGNOSIS — I10 Essential (primary) hypertension: Secondary | ICD-10-CM | POA: Insufficient documentation

## 2021-01-30 DIAGNOSIS — E782 Mixed hyperlipidemia: Secondary | ICD-10-CM | POA: Insufficient documentation

## 2021-01-30 DIAGNOSIS — J449 Chronic obstructive pulmonary disease, unspecified: Secondary | ICD-10-CM | POA: Insufficient documentation

## 2021-01-30 DIAGNOSIS — E119 Type 2 diabetes mellitus without complications: Secondary | ICD-10-CM | POA: Insufficient documentation

## 2021-01-30 DIAGNOSIS — F411 Generalized anxiety disorder: Secondary | ICD-10-CM | POA: Insufficient documentation

## 2021-07-19 DIAGNOSIS — E0590 Thyrotoxicosis, unspecified without thyrotoxic crisis or storm: Secondary | ICD-10-CM | POA: Insufficient documentation

## 2021-07-19 DIAGNOSIS — F331 Major depressive disorder, recurrent, moderate: Secondary | ICD-10-CM | POA: Insufficient documentation

## 2021-07-19 DIAGNOSIS — F1990 Other psychoactive substance use, unspecified, uncomplicated: Secondary | ICD-10-CM | POA: Insufficient documentation

## 2021-07-19 DIAGNOSIS — R001 Bradycardia, unspecified: Secondary | ICD-10-CM | POA: Insufficient documentation

## 2022-06-20 ENCOUNTER — Ambulatory Visit: Payer: 59 | Admitting: Podiatry

## 2022-06-22 ENCOUNTER — Ambulatory Visit: Payer: 59 | Admitting: Podiatry

## 2022-06-23 ENCOUNTER — Ambulatory Visit: Payer: 59 | Admitting: Podiatry

## 2022-06-29 ENCOUNTER — Encounter: Payer: Self-pay | Admitting: Podiatry

## 2022-06-29 ENCOUNTER — Ambulatory Visit (INDEPENDENT_AMBULATORY_CARE_PROVIDER_SITE_OTHER): Payer: 59 | Admitting: Podiatry

## 2022-06-29 DIAGNOSIS — G621 Alcoholic polyneuropathy: Secondary | ICD-10-CM | POA: Diagnosis not present

## 2022-06-29 DIAGNOSIS — M19071 Primary osteoarthritis, right ankle and foot: Secondary | ICD-10-CM

## 2022-06-29 DIAGNOSIS — M778 Other enthesopathies, not elsewhere classified: Secondary | ICD-10-CM

## 2022-06-29 DIAGNOSIS — M79676 Pain in unspecified toe(s): Secondary | ICD-10-CM

## 2022-06-29 DIAGNOSIS — B351 Tinea unguium: Secondary | ICD-10-CM

## 2022-06-29 DIAGNOSIS — M19072 Primary osteoarthritis, left ankle and foot: Secondary | ICD-10-CM | POA: Diagnosis not present

## 2022-06-29 MED ORDER — TRIAMCINOLONE ACETONIDE 40 MG/ML IJ SUSP
40.0000 mg | Freq: Once | INTRAMUSCULAR | Status: AC
Start: 2022-06-29 — End: 2022-06-29
  Administered 2022-06-29: 40 mg

## 2022-07-01 NOTE — Progress Notes (Signed)
Subjective:  Patient ID: [name], male    DOB: [birth year],  MRN: [id number] HPI Chief Complaint  Patient presents with   Foot Pain    Plantar foot and toes bilateral - burning, numbness, tingling x 3-4 years, started in just the toes, now feels sensations up as far as mid lower leg some days, worse at night, "feels like I'm walking on sponges", takes Lyrica 150mg  TID (no help), does have lower back issues   New Patient (Initial Visit)    62 y.o. male presents with the above complaint.   ROS: Denies fever chills nausea vomit muscle aches pains calf pain back pain chest pain shortness of breath.  Patient is very lethargic and sleepy in the chair today however he can carry discussion once aroused  No past medical history on file. He does relate drinking case of beer a day for past 45 years  Current Outpatient Medications:    amitriptyline (ELAVIL) 75 MG tablet, Take 75 mg by mouth at bedtime., Disp: , Rfl:    amLODipine (NORVASC) 5 MG tablet, Take 5 mg by mouth 2 (two) times daily., Disp: , Rfl:    citalopram (CELEXA) 40 MG tablet, Take 1 tablet by mouth daily., Disp: , Rfl:    mirabegron ER (MYRBETRIQ) 50 MG TB24 tablet, Take 1 tablet by mouth daily., Disp: , Rfl:    omeprazole (PRILOSEC) 40 MG capsule, TAKE 1 CAPSULE BY MOUTH TWICE DAILY. TAKE FIRST DOSE 30 MINS PRIOR TO MORNINGMEAL., Disp: , Rfl:    pregabalin (LYRICA) 150 MG capsule, Take 150 mg by mouth 3 (three) times daily., Disp: , Rfl:    rOPINIRole (REQUIP) 2 MG tablet, Take 2 mg by mouth at bedtime., Disp: , Rfl:    TRELEGY ELLIPTA 100-62.5-25 MCG/ACT AEPB, Inhale 1 puff into the lungs daily., Disp: , Rfl:    valsartan-hydrochlorothiazide (DIOVAN-HCT) 160-25 MG tablet, Take 1 tablet by mouth daily., Disp: , Rfl:    albuterol (VENTOLIN HFA) 108 (90 Base) MCG/ACT inhaler, Inhale into the lungs., Disp: , Rfl:   No Known Allergies Review of Systems Objective:  There were no vitals filed for this visit.  General: Well  developed, nourished, in no acute distress, alert and oriented x3   Dermatological: Skin is warm, dry and supple bilateral. Nails x 10 are long thick yellow dystrophic painful remaining integument appears unremarkable at this time. There are no open sores, no preulcerative lesions, no rash or signs of infection present.  Vascular: Dorsalis Pedis artery and Posterior Tibial artery pedal pulses are 2/4 bilateral with immedate capillary fill time. Pedal hair growth present. No varicosities and no lower extremity edema present bilateral.   Neruologic: Grossly absent via light touch bilateral. Vibratory absent t via tuning fork bilateral. Protective threshold with Semmes Wienstein monofilament absent o all pedal sites bilateral including mid calf. Patellar and Achilles deep tendon reflexes 2+ bilateral. No Babinski or clonus noted bilateral.   Musculoskeletal: No gross boney pedal deformities bilateral. No pain, crepitus, or limitation noted with foot and ankle range of motion bilateral. Muscular strength 5/5 in all groups tested bilateral.  Pain in the midfoot with frontal plane range of motion osteoarthritic spurring palpable  Gait: Unassisted, Nonantalgic.    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  Capsulitis plantar aspect midfoot bilateral.  Pain limb secondary to onychomycosis  Plan: Did not change any of his medications.  Debrided nails bilaterally.  And injected his bilateral foot plantar medial  aspect 10 mg Kenalog 5 mg and Marcaine tolerated procedure well.     Jamy Whyte T. [place], [place]

## 2022-08-01 ENCOUNTER — Ambulatory Visit: Payer: 59 | Admitting: Podiatry

## 2022-09-15 ENCOUNTER — Encounter (HOSPITAL_COMMUNITY): Payer: Self-pay | Admitting: *Deleted

## 2022-09-15 ENCOUNTER — Inpatient Hospital Stay (HOSPITAL_COMMUNITY)
Admission: EM | Admit: 2022-09-15 | Discharge: 2022-09-19 | DRG: 194 | Disposition: A | Discharge status: Home / Self Care | Payer: 59 | Attending: Family Medicine | Admitting: Family Medicine

## 2022-09-15 ENCOUNTER — Emergency Department (HOSPITAL_COMMUNITY): Payer: 59

## 2022-09-15 ENCOUNTER — Other Ambulatory Visit: Payer: Self-pay

## 2022-09-15 DIAGNOSIS — G8929 Other chronic pain: Secondary | ICD-10-CM | POA: Diagnosis present

## 2022-09-15 DIAGNOSIS — F1290 Cannabis use, unspecified, uncomplicated: Secondary | ICD-10-CM | POA: Diagnosis present

## 2022-09-15 DIAGNOSIS — Z79899 Other long term (current) drug therapy: Secondary | ICD-10-CM

## 2022-09-15 DIAGNOSIS — E119 Type 2 diabetes mellitus without complications: Secondary | ICD-10-CM | POA: Diagnosis present

## 2022-09-15 DIAGNOSIS — J189 Pneumonia, unspecified organism: Secondary | ICD-10-CM | POA: Diagnosis present

## 2022-09-15 DIAGNOSIS — F32A Depression, unspecified: Secondary | ICD-10-CM | POA: Diagnosis present

## 2022-09-15 DIAGNOSIS — E785 Hyperlipidemia, unspecified: Secondary | ICD-10-CM | POA: Diagnosis present

## 2022-09-15 DIAGNOSIS — J440 Chronic obstructive pulmonary disease with acute lower respiratory infection: Secondary | ICD-10-CM | POA: Diagnosis present

## 2022-09-15 DIAGNOSIS — R001 Bradycardia, unspecified: Secondary | ICD-10-CM | POA: Diagnosis present

## 2022-09-15 DIAGNOSIS — F419 Anxiety disorder, unspecified: Secondary | ICD-10-CM | POA: Diagnosis present

## 2022-09-15 DIAGNOSIS — I76 Septic arterial embolism: Secondary | ICD-10-CM

## 2022-09-15 DIAGNOSIS — J449 Chronic obstructive pulmonary disease, unspecified: Secondary | ICD-10-CM | POA: Diagnosis not present

## 2022-09-15 DIAGNOSIS — F1910 Other psychoactive substance abuse, uncomplicated: Secondary | ICD-10-CM | POA: Diagnosis present

## 2022-09-15 DIAGNOSIS — B182 Chronic viral hepatitis C: Secondary | ICD-10-CM | POA: Diagnosis present

## 2022-09-15 DIAGNOSIS — K219 Gastro-esophageal reflux disease without esophagitis: Secondary | ICD-10-CM | POA: Diagnosis present

## 2022-09-15 DIAGNOSIS — R0781 Pleurodynia: Secondary | ICD-10-CM

## 2022-09-15 DIAGNOSIS — K224 Dyskinesia of esophagus: Secondary | ICD-10-CM | POA: Diagnosis present

## 2022-09-15 DIAGNOSIS — Z7951 Long term (current) use of inhaled steroids: Secondary | ICD-10-CM | POA: Diagnosis not present

## 2022-09-15 DIAGNOSIS — Z79891 Long term (current) use of opiate analgesic: Secondary | ICD-10-CM | POA: Diagnosis not present

## 2022-09-15 DIAGNOSIS — I340 Nonrheumatic mitral (valve) insufficiency: Secondary | ICD-10-CM | POA: Diagnosis not present

## 2022-09-15 DIAGNOSIS — K5900 Constipation, unspecified: Secondary | ICD-10-CM | POA: Diagnosis present

## 2022-09-15 DIAGNOSIS — F17200 Nicotine dependence, unspecified, uncomplicated: Secondary | ICD-10-CM

## 2022-09-15 DIAGNOSIS — Z888 Allergy status to other drugs, medicaments and biological substances status: Secondary | ICD-10-CM

## 2022-09-15 DIAGNOSIS — Z801 Family history of malignant neoplasm of trachea, bronchus and lung: Secondary | ICD-10-CM

## 2022-09-15 DIAGNOSIS — M542 Cervicalgia: Secondary | ICD-10-CM | POA: Diagnosis present

## 2022-09-15 DIAGNOSIS — F17210 Nicotine dependence, cigarettes, uncomplicated: Secondary | ICD-10-CM | POA: Diagnosis present

## 2022-09-15 DIAGNOSIS — M549 Dorsalgia, unspecified: Secondary | ICD-10-CM | POA: Diagnosis present

## 2022-09-15 DIAGNOSIS — R918 Other nonspecific abnormal finding of lung field: Secondary | ICD-10-CM | POA: Diagnosis present

## 2022-09-15 DIAGNOSIS — I10 Essential (primary) hypertension: Secondary | ICD-10-CM | POA: Diagnosis present

## 2022-09-15 DIAGNOSIS — F1990 Other psychoactive substance use, unspecified, uncomplicated: Secondary | ICD-10-CM

## 2022-09-15 DIAGNOSIS — I38 Endocarditis, valve unspecified: Secondary | ICD-10-CM | POA: Diagnosis not present

## 2022-09-15 DIAGNOSIS — R768 Other specified abnormal immunological findings in serum: Secondary | ICD-10-CM | POA: Insufficient documentation

## 2022-09-15 LAB — URINALYSIS, ROUTINE W REFLEX MICROSCOPIC
Bilirubin Urine: NEGATIVE
Glucose, UA: NEGATIVE mg/dL
Hgb urine dipstick: NEGATIVE
Ketones, ur: NEGATIVE mg/dL
Leukocytes,Ua: NEGATIVE
Nitrite: NEGATIVE
Protein, ur: NEGATIVE mg/dL
Specific Gravity, Urine: 1.016 (ref 1.005–1.030)
pH: 7 (ref 5.0–8.0)

## 2022-09-15 LAB — CBC WITH DIFFERENTIAL/PLATELET
Abs Immature Granulocytes: 0.03 10*3/uL (ref 0.00–0.07)
Basophils Absolute: 0 10*3/uL (ref 0.0–0.1)
Basophils Relative: 0 %
Eosinophils Absolute: 0.2 10*3/uL (ref 0.0–0.5)
Eosinophils Relative: 2 %
HCT: 38.3 % — ABNORMAL LOW (ref 39.0–52.0)
Hemoglobin: 12.4 g/dL — ABNORMAL LOW (ref 13.0–17.0)
Immature Granulocytes: 0 %
Lymphocytes Relative: 21 %
Lymphs Abs: 2 10*3/uL (ref 0.7–4.0)
MCH: 29.4 pg (ref 26.0–34.0)
MCHC: 32.4 g/dL (ref 30.0–36.0)
MCV: 90.8 fL (ref 80.0–100.0)
Monocytes Absolute: 0.8 10*3/uL (ref 0.1–1.0)
Monocytes Relative: 8 %
Neutro Abs: 6.7 10*3/uL (ref 1.7–7.7)
Neutrophils Relative %: 69 %
Platelets: 279 10*3/uL (ref 150–400)
RBC: 4.22 MIL/uL (ref 4.22–5.81)
RDW: 14.3 % (ref 11.5–15.5)
WBC: 9.7 10*3/uL (ref 4.0–10.5)
nRBC: 0 % (ref 0.0–0.2)

## 2022-09-15 LAB — COMPREHENSIVE METABOLIC PANEL
ALT: 19 U/L (ref 0–44)
AST: 21 U/L (ref 15–41)
Albumin: 3.2 g/dL — ABNORMAL LOW (ref 3.5–5.0)
Alkaline Phosphatase: 66 U/L (ref 38–126)
Anion gap: 9 (ref 5–15)
BUN: 11 mg/dL (ref 6–20)
CO2: 23 mmol/L (ref 22–32)
Calcium: 8.9 mg/dL (ref 8.9–10.3)
Chloride: 103 mmol/L (ref 98–111)
Creatinine, Ser: 1.17 mg/dL (ref 0.61–1.24)
GFR, Estimated: >60 mL/min (ref >60)
Glucose, Bld: 103 mg/dL — ABNORMAL HIGH (ref 70–99)
Potassium: 3.8 mmol/L (ref 3.5–5.1)
Sodium: 135 mmol/L (ref 135–145)
Total Bilirubin: 0.2 mg/dL — ABNORMAL LOW (ref 0.3–1.2)
Total Protein: 6.3 g/dL — ABNORMAL LOW (ref 6.5–8.1)

## 2022-09-15 LAB — CK: Total CK: 36 U/L — ABNORMAL LOW (ref 49–397)

## 2022-09-15 LAB — SEDIMENTATION RATE: Sed Rate: 13 mm/hr (ref 0–16)

## 2022-09-15 LAB — PROCALCITONIN: Procalcitonin: <0.1 ng/mL

## 2022-09-15 LAB — C-REACTIVE PROTEIN: CRP: 1.1 mg/dL — ABNORMAL HIGH (ref <1.0)

## 2022-09-15 LAB — LACTIC ACID, PLASMA
Lactic Acid, Venous: 0.7 mmol/L (ref 0.5–1.9)
Lactic Acid, Venous: 0.7 mmol/L (ref 0.5–1.9)

## 2022-09-15 LAB — TROPONIN I (HIGH SENSITIVITY)
Troponin I (High Sensitivity): 4 ng/L (ref <18)
Troponin I (High Sensitivity): 4 ng/L (ref <18)

## 2022-09-15 LAB — HIV ANTIBODY (ROUTINE TESTING W REFLEX): HIV Screen 4th Generation wRfx: NONREACTIVE

## 2022-09-15 MED ORDER — SODIUM CHLORIDE 0.9 % IV SOLN
2.0000 g | Freq: Three times a day (TID) | INTRAVENOUS | Status: DC
  Administered 2022-09-15 – 2022-09-19 (×12): 2 g via INTRAVENOUS
  Filled 2022-09-15 (×12): qty 12.5

## 2022-09-15 MED ORDER — ROPINIROLE HCL 1 MG PO TABS
2.0000 mg | ORAL_TABLET | Freq: Every day | ORAL | Status: DC
  Administered 2022-09-15 – 2022-09-18 (×4): 2 mg via ORAL
  Filled 2022-09-15 (×4): qty 2

## 2022-09-15 MED ORDER — MORPHINE SULFATE (PF) 2 MG/ML IV SOLN
2.0000 mg | Freq: Once | INTRAVENOUS | Status: AC
  Administered 2022-09-15: 2 mg via INTRAVENOUS
  Filled 2022-09-15: qty 1

## 2022-09-15 MED ORDER — KETOROLAC TROMETHAMINE 30 MG/ML IJ SOLN
30.0000 mg | Freq: Four times a day (QID) | INTRAMUSCULAR | Status: DC
  Administered 2022-09-16 – 2022-09-19 (×11): 30 mg via INTRAVENOUS
  Filled 2022-09-15 (×20): qty 1

## 2022-09-15 MED ORDER — IOHEXOL 350 MG/ML SOLN
75.0000 mL | Freq: Once | INTRAVENOUS | Status: AC | PRN
  Administered 2022-09-15: 75 mL via INTRAVENOUS

## 2022-09-15 MED ORDER — ACETAMINOPHEN 325 MG PO TABS
650.0000 mg | ORAL_TABLET | Freq: Four times a day (QID) | ORAL | Status: DC
  Administered 2022-09-15 – 2022-09-19 (×13): 650 mg via ORAL
  Filled 2022-09-15 (×13): qty 2

## 2022-09-15 MED ORDER — ONDANSETRON HCL 4 MG/2ML IJ SOLN
4.0000 mg | Freq: Once | INTRAMUSCULAR | Status: AC
  Administered 2022-09-15: 4 mg via INTRAVENOUS
  Filled 2022-09-15: qty 2

## 2022-09-15 MED ORDER — PANTOPRAZOLE SODIUM 40 MG PO TBEC
40.0000 mg | DELAYED_RELEASE_TABLET | Freq: Every day | ORAL | Status: DC
  Administered 2022-09-15 – 2022-09-19 (×5): 40 mg via ORAL
  Filled 2022-09-15 (×5): qty 1

## 2022-09-15 MED ORDER — TRAZODONE HCL 50 MG PO TABS
100.0000 mg | ORAL_TABLET | Freq: Every evening | ORAL | Status: DC | PRN
  Administered 2022-09-15 – 2022-09-17 (×2): 200 mg via ORAL
  Administered 2022-09-18: 150 mg via ORAL
  Filled 2022-09-15 (×3): qty 4

## 2022-09-15 MED ORDER — POLYETHYLENE GLYCOL 3350 17 G PO PACK
17.0000 g | PACK | Freq: Every day | ORAL | Status: DC
  Administered 2022-09-16 – 2022-09-18 (×2): 17 g via ORAL
  Filled 2022-09-15 (×4): qty 1

## 2022-09-15 MED ORDER — UMECLIDINIUM BROMIDE 62.5 MCG/ACT IN AEPB
1.0000 | INHALATION_SPRAY | Freq: Every day | RESPIRATORY_TRACT | Status: DC
  Administered 2022-09-15 – 2022-09-19 (×5): 1 via RESPIRATORY_TRACT
  Filled 2022-09-15: qty 7

## 2022-09-15 MED ORDER — SODIUM CHLORIDE 0.9 % IV SOLN
2.0000 g | Freq: Once | INTRAVENOUS | Status: AC
  Administered 2022-09-15: 2 g via INTRAVENOUS
  Filled 2022-09-15: qty 12.5

## 2022-09-15 MED ORDER — IPRATROPIUM-ALBUTEROL 0.5-2.5 (3) MG/3ML IN SOLN: 3.0000 mL | Freq: Four times a day (QID) | RESPIRATORY_TRACT | Status: DC | PRN

## 2022-09-15 MED ORDER — ENOXAPARIN SODIUM 40 MG/0.4ML IJ SOSY
40.0000 mg | PREFILLED_SYRINGE | INTRAMUSCULAR | Status: DC
  Administered 2022-09-15 – 2022-09-18 (×4): 40 mg via SUBCUTANEOUS
  Filled 2022-09-15 (×4): qty 0.4

## 2022-09-15 MED ORDER — NICOTINE 21 MG/24HR TD PT24
21.0000 mg | MEDICATED_PATCH | Freq: Every day | TRANSDERMAL | Status: DC
  Administered 2022-09-15 – 2022-09-19 (×5): 21 mg via TRANSDERMAL
  Filled 2022-09-15 (×5): qty 1

## 2022-09-15 MED ORDER — CITALOPRAM HYDROBROMIDE 20 MG PO TABS
40.0000 mg | ORAL_TABLET | Freq: Every day | ORAL | Status: DC
  Administered 2022-09-15 – 2022-09-18 (×4): 40 mg via ORAL
  Filled 2022-09-15 (×4): qty 2

## 2022-09-15 MED ORDER — HYDROMORPHONE HCL 1 MG/ML IJ SOLN
0.5000 mg | INTRAMUSCULAR | Status: DC | PRN
  Administered 2022-09-15 – 2022-09-18 (×8): 0.5 mg via INTRAVENOUS
  Filled 2022-09-15 (×9): qty 0.5

## 2022-09-15 MED ORDER — HYDROMORPHONE HCL 1 MG/ML IJ SOLN: 0.5000 mg | INTRAMUSCULAR | Status: DC | PRN

## 2022-09-15 MED ORDER — FENTANYL CITRATE PF 50 MCG/ML IJ SOSY
25.0000 ug | PREFILLED_SYRINGE | Freq: Once | INTRAMUSCULAR | Status: AC
  Administered 2022-09-15: 25 ug via INTRAVENOUS
  Filled 2022-09-15: qty 1

## 2022-09-15 MED ORDER — VANCOMYCIN HCL 2000 MG/400ML IV SOLN
2000.0000 mg | Freq: Once | INTRAVENOUS | Status: AC
  Administered 2022-09-15: 2000 mg via INTRAVENOUS
  Filled 2022-09-15: qty 400

## 2022-09-15 MED ORDER — VANCOMYCIN HCL 750 MG/150ML IV SOLN
750.0000 mg | Freq: Two times a day (BID) | INTRAVENOUS | Status: DC
  Administered 2022-09-16 – 2022-09-19 (×7): 750 mg via INTRAVENOUS
  Filled 2022-09-15 (×10): qty 150

## 2022-09-15 MED ORDER — PREGABALIN 25 MG PO CAPS
150.0000 mg | ORAL_CAPSULE | Freq: Three times a day (TID) | ORAL | Status: DC
  Administered 2022-09-15 – 2022-09-19 (×12): 150 mg via ORAL
  Filled 2022-09-15 (×12): qty 2

## 2022-09-15 MED ORDER — SENNA 8.6 MG PO TABS
1.0000 | ORAL_TABLET | Freq: Every day | ORAL | Status: DC
  Administered 2022-09-15 – 2022-09-18 (×4): 8.6 mg via ORAL
  Filled 2022-09-15 (×4): qty 1

## 2022-09-15 MED ORDER — AMITRIPTYLINE HCL 25 MG PO TABS
75.0000 mg | ORAL_TABLET | Freq: Every day | ORAL | Status: DC
  Administered 2022-09-15 – 2022-09-18 (×4): 75 mg via ORAL
  Filled 2022-09-15 (×4): qty 3

## 2022-09-15 MED ORDER — FLUTICASONE FUROATE-VILANTEROL 100-25 MCG/ACT IN AEPB
1.0000 | INHALATION_SPRAY | Freq: Every day | RESPIRATORY_TRACT | Status: DC
  Administered 2022-09-15 – 2022-09-19 (×5): 1 via RESPIRATORY_TRACT
  Filled 2022-09-15 (×2): qty 28

## 2022-09-15 NOTE — Consult Note (Addendum)
NAME:  [name], MRN:  [id number], DOB:  04/17/1960, LOS: 0 ADMISSION DATE:  09/15/2022, CONSULTATION DATE:  09/15/2022 REFERRING MD:  Edwin Dada, DO, CHIEF COMPLAINT:  Cavitary lung nodules   History of Present Illness:  62 year old male with history of lingular lung abscess, remote IVDU, presents with 2 days of pleuritic thoracic pain, primarily over right scapula. Worse with cough and deep breath. Non-tender. 7/10. Steadily worsening, so he came to ED. Cough occasionally productive of bloody sputum for 2 weeks. No dyspnea. No chest pain. Denies fevers, chills, night sweats. Weight mostly stable, no unintentional loss. Decent energy and appetite. No hematuria. No abdominal pain or problems. Has seasonal allergies, but no sinus problems or nosebleeds.  Had chest pain in 2007, workup included percutaneous lung biopsy complicated by MRSA lung abscess. He underwent resection of the left lingula for this. Uncertain why lung biopsy was indicated. Around this time he had hematuria as well.  PET from 2009 shows reduction in size of LLL pleural mass with small central area of metabolic activity, but no evidence of lung cancer recurrence elsewhere on scan. CT was recommended for 3 month follow-up at that time, uncertain if it was ever done.  Family history notable for lung cancer in 43s in brother and sister.  Social history notable for remote IVDU, heavy smoking 1-2 ppd for lifetime, remote history of incarceration.  Pertinent  Medical History  Remote IV drug use ?Hepatitis C in chart  Significant Hospital Events: Including procedures, antibiotic start and stop dates in addition to other pertinent events   8/16 admit to family medicine teaching service  Interim History / Subjective:  Per HPI  Objective   Blood pressure (!) 127/95, pulse 65, temperature 97.9 F (36.6 C), temperature source Oral, resp. rate 16, height 6' (1.829 m), weight 102.1 kg, SpO2 95%.       No intake or output data  in the 24 hours ending 09/15/22 1628 Filed Weights   09/15/22 0433  Weight: 102.1 kg    Examination: No distress Oropharyngeal mucosa pink and moist without lesions Heart rate is normal, rhythm regular, radials strong Breathing comfortably on room air, lungs clear, no thoracic tenderness Skin warm and dry Alert and oriented  Labs: Electrolytes and kidney function normal Normal calcium Mildly low albumin Mild normocytic anemia  Imaging: CT chest with bilateral pulmonary nodules with early cavitation on left  Resolved Hospital Problem list   Resolved Problems:   * No resolved hospital problems. *   Assessment & Plan:  Active Problems:   * No active hospital problems. *  Bilateral cavitary lung lesions Risk factors for TB include history of incarceration, but no constitutional symptoms. Vasculitis is a possibility. IVDU, although remote, puts him at risk for bacteremia and endocarditis and thus hematogenous pneumonia, but no fevers or malaise argues against this. Malignancy is high on differential given smoking history and strong family history. Had a percutaneous lung biopsy in late 2000's, uncertain where these results are--he does not recall. Also question sarcoidosis. Aspergillosis probably less likely without eosinophilia. Broad lab workup per below, may need bronchoscopy +/- biopsy pending results. - TB testing, AFB smear and culture - ANCA serologies - Hep C serologies, positivity could support vasculitic syndrome - HIV - Follow blood cultures - ESR and CRP  History of COPD - not exacerbated Polysubstance use disorder - no recent IVDU DM 2 - glucose per BMP 103  Review of Systems:   Per HPI.  Past Medical History:  He,  has no past medical history on file.  Pertinent negatives include heart disease, kidney disease, liver disease  Surgical History:   Past Surgical History:  Procedure Laterality Date   CERVICAL SPINE SURGERY     Partial left lung resection  Left      Social History:   reports that he has been smoking cigarettes. He started smoking about 45 years ago. He has a 68.4 pack-year smoking history. He has never used smokeless tobacco. He reports current alcohol use. He reports that he does not currently use drugs.   Family History:  His family history includes Lung cancer in his brother and sister.   Allergies Allergies  Allergen Reactions   Diovan Hct [Valsartan-Hydrochlorothiazide] Other (See Comments)    Hypotension  Dizziness    Norvasc [Amlodipine] Other (See Comments)    Hypotension Dizziness      Home Medications  Prior to Admission medications   Medication Sig Start Date End Date Taking? Authorizing Provider  acetaminophen (TYLENOL) 500 MG tablet Take 1,500 mg by mouth daily as needed for moderate pain, fever or headache.   Yes [provider]  albuterol (VENTOLIN HFA) 108 (90 Base) MCG/ACT inhaler Inhale into the lungs.   Yes [provider]  amitriptyline (ELAVIL) 75 MG tablet Take 75 mg by mouth at bedtime. 04/25/22  Yes [provider]  citalopram (CELEXA) 40 MG tablet Take 40 mg by mouth at bedtime. 01/10/21  Yes [provider]  HYDROcodone-acetaminophen (NORCO) 10-325 MG tablet Take 1 tablet by mouth every 6 (six) hours as needed for severe pain (breakthrough pain).   Yes [provider]  omeprazole (PRILOSEC) 40 MG capsule TAKE 1 CAPSULE BY MOUTH TWICE DAILY. TAKE FIRST DOSE 30 MINS PRIOR TO MORNINGMEAL. 12/13/20  Yes [provider]  Oxycodone HCl 10 MG TABS Take 10 mg by mouth 3 (three) times daily.   Yes [provider]  pregabalin (LYRICA) 150 MG capsule Take 150 mg by mouth 3 (three) times daily. 06/27/22  Yes [provider]  rOPINIRole (REQUIP) 2 MG tablet Take 2 mg by mouth at bedtime. 04/25/22  Yes [provider]  traZODone (DESYREL) 100 MG tablet Take 100-200 mg by mouth at bedtime as needed for sleep.   Yes [provider]  TRELEGY ELLIPTA 100-62.5-25 MCG/ACT AEPB Inhale 1 puff into the lungs daily as needed (shortness of breath). 06/23/22  Yes [provider]    Marrianne Mood MD 09/15/2022, 4:28 PM

## 2022-09-15 NOTE — Progress Notes (Signed)
ED Pharmacy Antibiotic Sign Off An antibiotic consult was received from an ED provider for vancomycin and cefepime per pharmacy dosing for endocarditis. A chart review was completed to assess appropriateness.   The following one time order(s) were placed:  Vancomycin 2000 mg IV x 1  Cefepime 2 g IV x 1   Further antibiotic and/or antibiotic pharmacy consults should be ordered by the admitting provider if indicated.   Thank you for allowing pharmacy to be a part of this patient's care.   Griffin Dakin, [facility]  Clinical Pharmacist 09/15/22 2:37 PM

## 2022-09-15 NOTE — Assessment & Plan Note (Addendum)
Patient stable on room air, in significant pain. Working up multiple etiologies. - Admit to FMTS, Progressive Care, Dr. McDiarmid attending - Cefepime and vancomycin daily per pharmacy, adjust based on workup - Blood Cx 8/16 - Cardiology and pulmonology on board, f/u recs, appreciate pulmonology workup - Cardiac: f/u TTE - Respiratory: Droplet precautions, RPP, AFB, strep pneumo urine antigen - O2 goal 88-92% - Regular diet - Pain regimen: Dilaudid 0.5 mg IV Q4h PRN, acetaminophen 650 mg Q6h, Toradol 30 mg Q6h - Bowel regimen: Senna and Miralax daily - AM BMP and CBC - Follow-up respiratory panel

## 2022-09-15 NOTE — H&P (Cosign Needed Addendum)
Hospital Admission History and Physical Service Pager: [phone]  Patient name: [name] Medical record number: [id number] Date of Birth: 1961/01/29 Age: 62 y.o. Gender: male  Primary Care Provider: [facility] Consultants: Cardiology, Pulmonology Code Status: Full, which was confirmed with family present at bedside. No long term ventilation.  Preferred Emergency Contact:  Contact Information     Name Relation Home Work Mobile   Nelson,Linda Sister   [phone]      Other Contacts   None on File    Chief Complaint:   Assessment and Plan: [name] is a 62 y.o. male presenting with back and chest pain. Primary differential for this patient's presentation of this includes endocarditis with septic lung emboli versus metastatic cancer or primary lung cancer.  Given lack of IVDU reported for 1 year, it seems less likely that patient would have a sudden endocarditis and given extensive smoking history and family history, concern for cancer is significant.  No reason to suspect patient is not forthright with this history.  Additionally, lack of sick symptoms further leaning away from endocarditis.  Other infectious etiologies possible, including bacterial or fungal.  However, again, no significant infectious Sx.  TB considered given hemoptysis and incarceration, but no constitutional symptoms and labs mostly unremarkable.  Pulmonology working up sarcoidosis as well.  Brought on differential diagnosis involves pneumonia, COPD exacerbation, ACS however ruled out with imaging and flat troponin. Assessment & Plan Septic embolism (HCC) Patient stable on room air, in significant pain. Working up multiple etiologies. - Admit to FMTS, Progressive Care, Dr. McDiarmid attending - Cefepime and vancomycin daily per pharmacy, adjust based on workup - Blood Cx 8/16 - Cardiology and pulmonology on board, f/u recs, appreciate pulmonology workup -  Cardiac: f/u TTE - Respiratory: Droplet precautions, RPP, AFB, strep pneumo urine antigen - O2 goal 88-92% - Regular diet - Pain regimen: Dilaudid 0.5 mg IV Q4h PRN, acetaminophen 650 mg Q6h, Toradol 30 mg Q6h - Bowel regimen: Senna and Miralax daily - AM BMP and CBC - Follow-up respiratory panel Tobacco use disorder 1.5 PPD for ~45 years. - Nicotine replacement patch 21 mg daily Substance use disorder Patient with substance use history, will likely have high threshold for pain medicines to have effect, pain meds as above. - f/u UDS  Chronic and stable Chronic pain: Pregabalin 150 mg TID, trazodone 100-200 mg PRN COPD: Duoneb Q6h PRN, Breo/Incruse Ellipta GERD: Pantoprazole 40 mg Depression, anxiety: Citalopram 40 mg, amitriptyline 75 mg  FEN/GI: Regular diet, no mIVF VTE Prophylaxis: Lovenox  Disposition: Progressive  History of Present Illness: [name] is a 62 y.o. male with a pertinent PMH of COPD, LLL resection, presenting with right-sided pleuritic chest pain.  Patient endorses right sided pleuritic pain. Notes the pain started 2 days ago and has been gradually worsening. Denies recent sickness, fevers, vomiting, new cough. He has a regular smokers cough, has been smoking 1-2 ppd for the last 45 years (since he was 17). He used to shoot dope but it has been over a year since last doing it. When he gets up in the morning, he does note some red blood in his spit with chronic smoker's cough. That has been present for the last 2 weeks. About 5 years ago, he was urinating blood. No blood in urine now, also has normal stools, denies blood in stool.  In the ED, patient presented with stable vital signs but in significant chest/back pain.  Received  CXR showing RM/U lung opacity and CT chest septic emboli versus metastatic spread.  Blood cultures were obtained.  CMP was drawn, overal WNL.  Lactate also WNL and troponin flat (7>7).  Given fentanyl and morphine for pain.  EKG obtained  without concern for ischemia or other pathological finding.  Given ondansetron for nausea and started on cefepime 2 g.  UDS and UA obtained, no sign of UTI.  CTA chest confirmed pulmonary mets versus septic emboli.  Cardiology and pulmonology consulted for workup and FMTS consulted for admission for suspected septic emboli.  Review Of Systems: Per HPI  Pertinent Past Medical History: - COPD - T2DM - GERD - HTN - HLD - IV drug use - GAD - Depressive disorder  Remainder reviewed in history tab.   Pertinent Past Surgical History: - Partial left lung resection  Remainder reviewed in history tab.  Pertinent Social History: Tobacco use: 1-2 ppd x 45 years Alcohol use: 1 beer/day Other Substance use: Used to [place] IV drugs 1 year ago, smokes marijuana a few times a week Lives with some friends of his, dog  Pertinent Family History: - Brother and sister with lung cancer, both died in their 30s  Remainder reviewed in history tab.   Important Outpatient Medications: - Trelegy daily, albuterol PRN - Norco 0.5 mg Q3h PRN - Oxycodone 10 mg TID - Pregabalin 150 mg TID - Ropinirole 2 mg daily - Amitriptyline 75 mg at bedtime - Trazodone 100-200 mg at bedtime  Remainder reviewed in medication history.   Objective: BP 112/89   Pulse (!) 53   Temp 97.9 F (36.6 C) (Oral)   Resp 16   Ht 6' (1.829 m)   Wt 102.1 kg   SpO2 90%   BMI 30.52 kg/m   Physical Exam: General: Adult male resting in bed in lateral decubitus position, appears uncomfortable. Eyes: No scleral icterus, injections.  PERRLA. ENTM: No oral lesions, clear oropharynx, poor dentition. Neck: No LAD Cardiovascular: Normal S1/S2, no murmur/rub/gallop. No JVD. Respiratory: Difficult to auscultate and limited exam, inspiration limited by pain.  No crackles heard.  Normal WOB on room air, no respiratory distress. Gastrointestinal: No abdominal TTP. MSK: Resting in bed, not moving due to pain. Extremities: Capillary  refill <2 seconds. No splinter hemorrhage, digital cyanosis or clubbing.  No peripheral edema. 2+ DP pulses. Derm: No rashes grossly. Neuro: Alert and oriented x4. No focal neurological deficit. Psych: Full range affect, makes jokes, appropriate.  Labs:  CBC BMET  Recent Labs  Lab 09/15/22 1037  WBC 9.7  HGB 12.4*  HCT 38.3*  PLT 279   Recent Labs  Lab 09/15/22 1037  NA 135  K 3.8  CL 103  CO2 23  BUN 11  CREATININE 1.17  GLUCOSE 103*  CALCIUM 8.9     Pertinent additional labs: - Trops flat (4>4) - Lactate WNL (0.7>0.7)  EKG (my own interpretation): NSR, no axis deviation, no ischemic changes.  Imaging Studies Performed: - CXR: Right middle/upper lung opacity. Increased interstitial markings. - CT chest w/o contrast: Irregular pulmonary nodules bilaterally with very early cavitation on the left. Septic emboli/hematogenous pneumonia is a leading consideration versus metastatic disease. - CTA chest: Negative for PE, Multiple spiculated appearing pulmonary nodules, including a small, cavitary nodule RLL.  Concerning for pulmonary metastases versus septic emboli.  Dimitry Sharion Dove, MD 09/15/2022, 6:59 PM  Family Medicine  FPTS Intern pager: [phone], text pages welcome Secure chat group [facility] Teaching Service   I was personally  present and performed or re-performed the history, physical exam and medical decision making activities of this service and have verified that the service and findings are accurately documented in the student's note.  Shelby Mattocks, DO                  09/15/2022, 6:59 PM

## 2022-09-15 NOTE — Assessment & Plan Note (Deleted)
-   Admit to FMTS, Progressive Care,  - Cardiology and pulmonology on board, f/u recs - f/u TTE - Pulmonary

## 2022-09-15 NOTE — Progress Notes (Signed)
FMTS Brief Progress Note  S:Patient sleeping comfortably in room. Discussed with nurse, who voiced no complaints or questions. Patient asked nurse for trazodone and diluadid for pain control. No further complaints.   O: BP (!) 144/68 (BP Location: Right Arm)   Pulse 74   Temp 98 F (36.7 C) (Oral)   Resp 18   Ht 6' (1.829 m)   Wt 227 lb 11.8 oz (103.3 kg)   SpO2 92%   BMI 30.89 kg/m    Physical Exam:  General: Sleeping comfortably   Pulm: Normal work of breathing on RA  A/P: [name] is a 62 y.o. male who presented to the ED w/ back and chest pain. CTA concerning pulmonary mets vs septic emboli. Pulm and Cards following for workup and recs. Pain controledl w/ dilaudid prn, scheduled tylenol and Toradol. Follow-up images and labs collected. Vanc and cefepime daily per pharmacy .   - Plan per AM team, refer to H&P  - Sats O2 goal between 88-92%, no need  for oxygen if in goal range  - dilaudid q4h prn x1, tylenol 650 q6h x1, ketorolac refused by patient per chart - Orders reviewed. Labs for AM ordered, which was adjusted as needed.  - If condition changes, plan includes page if any concerns or questions.   Peterson Ao, MD 09/15/2022, 10:33 PM PGY-1,  Family Medicine Night Resident  Please page [phone] with questions.

## 2022-09-15 NOTE — Progress Notes (Addendum)
Pharmacy Antibiotic Note  [name] is a 62 y.o. male admitted on 09/15/2022 with  potential septic emboli vs. metastatic lung disease.  Pt has a history of IVDU. CBC wnl, CRP 1.1, afebrile. Pharmacy has been consulted for vancomycin and cefepime dosing.  Plan: Start cefepime 2 g IV every 8 hours  Start vancomycin 750 mg IV every 12 hours (eAUC 448)  Monitor WBC, temperature, renal function, and cultures F/u abx plans   Height: 6' (182.9 cm) Weight: 102.1 kg (225 lb) IBW/kg (Calculated) : 77.6  Temp (24hrs), Avg:97.9 F (36.6 C), Min:97.3 F (36.3 C), Max:98.6 F (37 C)  Recent Labs  Lab 09/15/22 1037 09/15/22 1250  WBC 9.7  --   CREATININE 1.17  --   LATICACIDVEN 0.7 0.7    Estimated Creatinine Clearance: 83 mL/min (by C-G formula based on SCr of 1.17 mg/dL).    Allergies  Allergen Reactions   Diovan Hct [Valsartan-Hydrochlorothiazide] Other (See Comments)    Hypotension  Dizziness    Norvasc [Amlodipine] Other (See Comments)    Hypotension Dizziness     Antimicrobials this admission: 8/16 cefepime >>  8/16 vancomycin >>   Dose adjustments this admission:   Microbiology results: 8/16 BCx:   Thank you for allowing pharmacy to be a part of this patient's care.  Griffin Dakin 09/15/2022 6:31 PM

## 2022-09-15 NOTE — ED Provider Notes (Signed)
[place] [facility]ENC[facility] DEPARTMENT AT [facility] Provider Note   CSN: [id number] Arrival date & time: 09/15/22  5621     History  Chief Complaint  Patient presents with   Back Pain    [name] is a 62 y.o. male.  62 year old male presents for concern of upper back pain, and chest pain.  Worse with taking a deep breath.  Also reports cough of couple week duration.  This is nonproductive.  He has history of IV drug use.  States he has not used IV drugs in about a year.  Denies alcohol use as well.  Does endorse smoking marijuana.  He denies any other complaints at this time.  He denies fever.  Denies coughing up blood but states when he spits up occasionally he does notice blood.  Denies melanotic stools.  The history is provided by the patient. No language interpreter was used.       Home Medications Prior to Admission medications   Medication Sig Start Date End Date Taking? Authorizing Provider  albuterol (VENTOLIN HFA) 108 (90 Base) MCG/ACT inhaler Inhale into the lungs.    [provider]  amitriptyline (ELAVIL) 75 MG tablet Take 75 mg by mouth at bedtime. 04/25/22   [provider]  amLODipine (NORVASC) 5 MG tablet Take 5 mg by mouth 2 (two) times daily. 04/25/22   [provider]  citalopram (CELEXA) 40 MG tablet Take 1 tablet by mouth daily. 01/10/21   [provider]  mirabegron ER (MYRBETRIQ) 50 MG TB24 tablet Take 1 tablet by mouth daily. 06/06/21   [provider]  omeprazole (PRILOSEC) 40 MG capsule TAKE 1 CAPSULE BY MOUTH TWICE DAILY. TAKE FIRST DOSE 30 MINS PRIOR TO MORNINGMEAL. 12/13/20   [provider]  pregabalin (LYRICA) 150 MG capsule Take 150 mg by mouth 3 (three) times daily. 06/27/22   [provider]  rOPINIRole (REQUIP) 2 MG tablet Take 2 mg by mouth at bedtime. 04/25/22   [provider]  TRELEGY ELLIPTA 100-62.5-25 MCG/ACT AEPB Inhale 1 puff into the lungs daily. 06/23/22    [provider]  valsartan-hydrochlorothiazide (DIOVAN-HCT) 160-25 MG tablet Take 1 tablet by mouth daily. 07/07/21   [provider]      Allergies    Patient has no known allergies.    Review of Systems   Review of Systems  Constitutional:  Negative for fever.  Respiratory:  Positive for cough and shortness of breath.   Cardiovascular:  Positive for chest pain.  Neurological:  Negative for light-headedness.  All other systems reviewed and are negative.   Physical Exam Updated Vital Signs BP 136/79   Pulse 65   Temp 98.1 F (36.7 C) (Oral)   Resp 19   Ht 6' (1.829 m)   Wt 102.1 kg   SpO2 93%   BMI 30.52 kg/m  Physical Exam Vitals and nursing note reviewed.  Constitutional:      General: He is not in acute distress.    Appearance: Normal appearance. He is not ill-appearing.  HENT:     Head: Normocephalic and atraumatic.     Nose: Nose normal.  Eyes:     General: No scleral icterus.    Extraocular Movements: Extraocular movements intact.     Conjunctiva/sclera: Conjunctivae normal.  Cardiovascular:     Rate and Rhythm: Normal rate and regular rhythm.  Pulmonary:     Effort: Pulmonary effort is normal. No respiratory distress.     Breath sounds: Normal breath  sounds. No wheezing or rales.  Abdominal:     Palpations: Abdomen is soft.  Musculoskeletal:        General: Normal range of motion.     Cervical back: Normal range of motion.  Skin:    General: Skin is warm and dry.  Neurological:     General: No focal deficit present.     Mental Status: He is alert. Mental status is at baseline.     ED Results / Procedures / Treatments   Labs (all labs ordered are listed, but only abnormal results are displayed) Labs Reviewed  CBC WITH DIFFERENTIAL/PLATELET - Abnormal; Notable for the following components:      Result Value   Hemoglobin 12.4 (*)    HCT 38.3 (*)    All other components within normal limits  COMPREHENSIVE METABOLIC PANEL -  Abnormal; Notable for the following components:   Glucose, Bld 103 (*)    Total Protein 6.3 (*)    Albumin 3.2 (*)    Total Bilirubin 0.2 (*)    All other components within normal limits  CULTURE, BLOOD (ROUTINE X 2)  CULTURE, BLOOD (ROUTINE X 2)  LACTIC ACID, PLASMA  LACTIC ACID, PLASMA  TROPONIN I (HIGH SENSITIVITY)  TROPONIN I (HIGH SENSITIVITY)    EKG None  Radiology CT Angio Chest PE W and/or Wo Contrast  Result Date: 09/15/2022 CLINICAL DATA:  PE suspected, upper back pain onset 2 days ago, abnormal CT * Tracking Code: BO * EXAM: CT ANGIOGRAPHY CHEST WITH CONTRAST TECHNIQUE: Multidetector CT imaging of the chest was performed using the standard protocol during bolus administration of intravenous contrast. Multiplanar CT image reconstructions and MIPs were obtained to evaluate the vascular anatomy. RADIATION DOSE REDUCTION: This exam was performed according to the departmental dose-optimization program which includes automated exposure control, adjustment of the mA and/or kV according to patient size and/or use of iterative reconstruction technique. CONTRAST:  75mL OMNIPAQUE IOHEXOL 350 MG/ML SOLN COMPARISON:  CT chest, 09/15/2022, 6:31 a.m. FINDINGS: Cardiovascular: Satisfactory opacification of the pulmonary arteries to the segmental level. No evidence of pulmonary embolism. Normal heart size. Left coronary artery calcifications. No pericardial effusion. Aortic atherosclerosis. Mediastinum/Nodes: No enlarged mediastinal, hilar, or axillary lymph nodes. Small calcified granulomatous left hilar lymph nodes, benign, requiring no further follow-up or characterization. Thyroid gland, trachea, and esophagus demonstrate no significant findings. Lungs/Pleura: Mild paraseptal emphysema. Bibasilar scarring and atelectasis, unchanged. Multiple spiculated appearing pulmonary nodules, largest in the posterior peripheral right upper lobe measuring 2.2 x 1.8 cm (series 7, image 45), as well as a small,  cavitary nodule of the superior segment right lower lobe measuring 1.0 x 0.7 cm (series 7, image 52). No pleural effusion or pneumothorax. Upper Abdomen: No acute abnormality.  Coarse contour of the liver. Musculoskeletal: No chest wall abnormality. No acute osseous findings. Review of the MIP images confirms the above findings. IMPRESSION: 1. Negative examination for pulmonary embolism. 2. Multiple spiculated appearing pulmonary nodules, including a small, cavitary nodule of the superior segment right lower lobe. Findings remain highly concerning for pulmonary metastases versus septic emboli and not appreciably changed compared to prior same day CT. 3. Coarse contour of the liver, suggestive of cirrhosis. 4. Coronary artery disease. Aortic Atherosclerosis (ICD10-I70.0) and Emphysema (ICD10-J43.9). Electronically Signed   By: Jearld Lesch M.D.   On: 09/15/2022 13:38   CT CHEST WO CONTRAST  Result Date: 09/15/2022 CLINICAL DATA:  Abnormal x-ray, lung nodule EXAM: CT CHEST WITHOUT CONTRAST TECHNIQUE: Multidetector CT imaging of the chest was performed  following the standard protocol without IV contrast. RADIATION DOSE REDUCTION: This exam was performed according to the departmental dose-optimization program which includes automated exposure control, adjustment of the mA and/or kV according to patient size and/or use of iterative reconstruction technique. COMPARISON:  Radiograph from earlier today.  Chest CT 11/01/2005 FINDINGS: Cardiovascular: No significant vascular findings. Normal heart size. No pericardial effusion. Mild atheromatous calcification. Mediastinum/Nodes: No mass or adenopathy. Calcified left hilar lymph nodes attributed to prior granulomatous disease. Lungs/Pleura: Bilateral pulmonary nodules, the largest measuring 2 cm in the right upper lobe with halo of ground-glass density and subjacent mild pleural thickening. In the left lung there is a lower lobe nodule with very early cavitation.  Micronodular centrilobular nodules seen bilaterally, age indeterminate. No lobar consolidation, effusion, or air leak. Mild paraseptal emphysema. Calcified nodule in the left lung. Upper Abdomen: Large caudate lobe and fissures but no definite cirrhotic liver surface. Granulomatous calcification in the spleen. Musculoskeletal: Generalized degeneration without acute or focal finding. IMPRESSION: Irregular pulmonary nodules bilaterally with very early cavitation on the left. Septic emboli/hematogenous pneumonia is a leading consideration given chart history IV drug abuse. Metastatic disease could also have this pattern. Electronically Signed   By: Tiburcio Pea M.D.   On: 09/15/2022 06:54   DG Chest 2 View  Result Date: 09/15/2022 CLINICAL DATA:  Back pain worse with inspiration. EXAM: CHEST - 2 VIEW COMPARISON:  10/05/2018 FINDINGS: Low volume film. Interstitial markings are diffusely coarsened with chronic features. Interval development of a 15 mm nodular opacity over the right mid to upper lung. Scarring noted at both lung bases. The cardiopericardial silhouette is within normal limits for size. Defect in the posterior left sixth rib may be posttraumatic or from prior surgery. IMPRESSION: 1. Interval development of a 15 mm nodular opacity over the right mid to upper lung. CT chest without contrast recommended to further evaluate. 2. Chronic interstitial coarsening with bibasilar scarring. Electronically Signed   By: Kennith Center M.D.   On: 09/15/2022 05:21    Procedures Procedures    Medications Ordered in ED Medications  ceFEPIme (MAXIPIME) 2 g in sodium chloride 0.9 % 100 mL IVPB (has no administration in time range)  vancomycin (VANCOREADY) IVPB 2000 mg/400 mL (has no administration in time range)  fentaNYL (SUBLIMAZE) injection 25 mcg (25 mcg Intravenous Given 09/15/22 1046)  ondansetron (ZOFRAN) injection 4 mg (4 mg Intravenous Given 09/15/22 1046)  iohexol (OMNIPAQUE) 350 MG/ML injection 75  mL (75 mLs Intravenous Contrast Given 09/15/22 1228)  morphine (PF) 2 MG/ML injection 2 mg (2 mg Intravenous Given 09/15/22 1245)  ondansetron (ZOFRAN) injection 4 mg (4 mg Intravenous Given 09/15/22 1245)    ED Course/ Medical Decision Making/ A&P                                 Medical Decision Making Amount and/or Complexity of Data Reviewed Labs: ordered. Radiology: ordered.  Risk Prescription drug management. Decision regarding hospitalization.   62 year old male presents today for concern of chest pain.  This is pleuritic in nature.  Has history of COPD status post lung resection, IV drug use history.  Does report 100-pack-year smoking history.  CT chest without contrast, and chest x-ray were ordered overnight.  CT chest without contrast demonstrates irregular pulmonary nodules bilaterally with early cavitation on the left.  Concern for septic emboli versus hematogenous pneumonia, and endocarditis on the differential per radiology read.  Will obtain CT angio  PE study, and blood work.  CBC without leukocytosis, hemoglobin of 12.4.  CMP without acute concerns.  Troponin negative x 2.  EKG without acute ischemic changes.  Without lactic acidosis.  Blood cultures obtained due to concern for endocarditis.  Broad spectrum antibiotics started.  Echocardiogram ordered.  Discussed with pulmonology to evaluate patient to further delineate the concern of septic emboli.  Will discuss with cardiology to follow as well after echo results.  Cardiology will follow.  Will discuss with hospitalist.  Discussed with family medicine teaching service who will evaluate patient for admission.   Final Clinical Impression(s) / ED Diagnoses Final diagnoses:  Pulmonary nodules  Abnormal CT scan of lung    Rx / DC Orders ED Discharge Orders     None         Marita Kansas, PA-C 09/15/22 1505    Franne Forts, DO 09/16/22 [phone]

## 2022-09-15 NOTE — ED Notes (Signed)
ED TO INPATIENT HANDOFF REPORT  ED Nurse Name and Phone #:   S Name/Age/Gender [name] 62 y.o. male Room/Bed: 025C/025C  Code Status   Code Status: Full Code  Home/SNF/Other Home Patient oriented to: self, place, time, and situation Is this baseline? Yes   Triage Complete: Triage complete  Chief Complaint Septic embolism [facility]) [[id number]]  Triage Note C/o upper back pain onset 2 days ago states he has a history of disc problems pain is worse with inspiration and movement.    Allergies Allergies  Allergen Reactions   Diovan Hct [Valsartan-Hydrochlorothiazide] Other (See Comments)    Hypotension  Dizziness    Norvasc [Amlodipine] Other (See Comments)    Hypotension Dizziness     Level of Care/Admitting Diagnosis ED Disposition     ED Disposition  Admit   Condition  --   Comment  Hospital Area: [facility] [[id number]]  Level of Care: Progressive [102]  Admit to Progressive based on following criteria: MULTISYSTEM THREATS such as stable sepsis, metabolic/electrolyte imbalance with or without encephalopathy that is responding to early treatment.  May admit patient to [facility] or [facility] if equivalent level of care is available:: Yes  Covid Evaluation: Asymptomatic - no recent exposure (last 10 days) testing not required  Diagnosis: Septic embolism [facility]) [[id number]]  Admitting Physician: Nelia Shi [[id number]]  Attending Physician: Acquanetta Belling D [[id number]]  Certification:: I certify this patient will need inpatient services for at least 2 midnights  Expected Medical Readiness: 09/18/2022          B Medical/Surgery History History reviewed. No pertinent past medical history. Past Surgical History:  Procedure Laterality Date   CERVICAL SPINE SURGERY     Partial left lung resection Left      A IV Location/Drains/Wounds Patient Lines/Drains/Airways Status     Active Line/Drains/Airways     Name Placement date Placement time Site  Days   Peripheral IV 09/15/22 20 G 1" Anterior;Left Forearm 09/15/22  1030  Forearm  less than 1   Peripheral IV 09/15/22 20 G Right Antecubital 09/15/22  1025  Antecubital  less than 1            Intake/Output Last 24 hours No intake or output data in the 24 hours ending 09/15/22 1828  Labs/Imaging Results for orders placed or performed during the hospital encounter of 09/15/22 (from the past 48 hour(s))  CBC with Differential     Status: Abnormal   Collection Time: 09/15/22 10:37 AM  Result Value Ref Range   WBC 9.7 4.0 - 10.5 K/uL   RBC 4.22 4.22 - 5.81 MIL/uL   Hemoglobin 12.4 (L) 13.0 - 17.0 g/dL   HCT 11.9 (L) 14.7 - 82.9 %   MCV 90.8 80.0 - 100.0 fL   MCH 29.4 26.0 - 34.0 pg   MCHC 32.4 30.0 - 36.0 g/dL   RDW 56.2 13.0 - 86.5 %   Platelets 279 150 - 400 K/uL   nRBC 0.0 0.0 - 0.2 %   Neutrophils Relative % 69 %   Neutro Abs 6.7 1.7 - 7.7 K/uL   Lymphocytes Relative 21 %   Lymphs Abs 2.0 0.7 - 4.0 K/uL   Monocytes Relative 8 %   Monocytes Absolute 0.8 0.1 - 1.0 K/uL   Eosinophils Relative 2 %   Eosinophils Absolute 0.2 0.0 - 0.5 K/uL   Basophils Relative 0 %   Basophils Absolute 0.0 0.0 - 0.1 K/uL   Immature Granulocytes 0 %   Abs  Immature Granulocytes 0.03 0.00 - 0.07 K/uL    Comment: Performed at [facility], [address], [place], [place] [address]  Comprehensive metabolic panel     Status: Abnormal   Collection Time: 09/15/22 10:37 AM  Result Value Ref Range   Sodium 135 135 - 145 mmol/L   Potassium 3.8 3.5 - 5.1 mmol/L   Chloride 103 98 - 111 mmol/L   CO2 23 22 - 32 mmol/L   Glucose, Bld 103 (H) 70 - 99 mg/dL    Comment: Glucose reference range applies only to samples taken after fasting for at least 8 hours.   BUN 11 6 - 20 mg/dL   Creatinine, Ser 4.78 0.61 - 1.24 mg/dL   Calcium 8.9 8.9 - 29.5 mg/dL   Total Protein 6.3 (L) 6.5 - 8.1 g/dL   Albumin 3.2 (L) 3.5 - 5.0 g/dL   AST 21 15 - 41 U/L   ALT 19 0 - 44 U/L   Alkaline Phosphatase 66 38  - 126 U/L   Total Bilirubin 0.2 (L) 0.3 - 1.2 mg/dL   GFR, Estimated >62 >13 mL/min    Comment: (NOTE) Calculated using the CKD-EPI Creatinine Equation (2021)    Anion gap 9 5 - 15    Comment: Performed at [facility], [address], [place], [place] [address]  Troponin I (High Sensitivity)     Status: None   Collection Time: 09/15/22 10:37 AM  Result Value Ref Range   Troponin I (High Sensitivity) 4 <18 ng/L    Comment: (NOTE) Elevated high sensitivity troponin I (hsTnI) values and significant  changes across serial measurements may suggest ACS but many other  chronic and acute conditions are known to elevate hsTnI results.  Refer to the "Links" section for chest pain algorithms and additional  guidance. Performed at [facility], [address], [place], [place] [address]   Lactic acid, plasma     Status: None   Collection Time: 09/15/22 10:37 AM  Result Value Ref Range   Lactic Acid, Venous 0.7 0.5 - 1.9 mmol/L    Comment: Performed at [facility], [address], [place], [place] [address]  Troponin I (High Sensitivity)     Status: None   Collection Time: 09/15/22 12:30 PM  Result Value Ref Range   Troponin I (High Sensitivity) 4 <18 ng/L    Comment: (NOTE) Elevated high sensitivity troponin I (hsTnI) values and significant  changes across serial measurements may suggest ACS but many other  chronic and acute conditions are known to elevate hsTnI results.  Refer to the "Links" section for chest pain algorithms and additional  guidance. Performed at [facility], [address], [place], [place] [address]   Lactic acid, plasma     Status: None   Collection Time: 09/15/22 12:50 PM  Result Value Ref Range   Lactic Acid, Venous 0.7 0.5 - 1.9 mmol/L    Comment: Performed at [facility], [address], [place], [place] [address]  C-reactive protein     Status: Abnormal   Collection Time: 09/15/22  4:12 PM  Result Value Ref Range   CRP 1.1  (H) <1.0 mg/dL    Comment: Performed at [facility], [address] [place], [place] [address]  CK     Status: Abnormal   Collection Time: 09/15/22  4:12 PM  Result Value Ref Range   Total CK 36 (L) 49 - 397 U/L  Comment: Performed at [facility], [address] [place], [place] [address]  Urinalysis, Routine w reflex microscopic -Urine, Random     Status: Abnormal   Collection Time: 09/15/22  5:01 PM  Result Value Ref Range   Color, Urine STRAW (A) YELLOW   APPearance CLEAR CLEAR   Specific Gravity, Urine 1.016 1.005 - 1.030   pH 7.0 5.0 - 8.0   Glucose, UA NEGATIVE NEGATIVE mg/dL   Hgb urine dipstick NEGATIVE NEGATIVE   Bilirubin Urine NEGATIVE NEGATIVE   Ketones, ur NEGATIVE NEGATIVE mg/dL   Protein, ur NEGATIVE NEGATIVE mg/dL   Nitrite NEGATIVE NEGATIVE   Leukocytes,Ua NEGATIVE NEGATIVE    Comment: Performed at [facility], [address] [place], [place] [address]   CT Angio Chest PE W and/or Wo Contrast  Result Date: 09/15/2022 CLINICAL DATA:  PE suspected, upper back pain onset 2 days ago, abnormal CT * Tracking Code: BO * EXAM: CT ANGIOGRAPHY CHEST WITH CONTRAST TECHNIQUE: Multidetector CT imaging of the chest was performed using the standard protocol during bolus administration of intravenous contrast. Multiplanar CT image reconstructions and MIPs were obtained to evaluate the vascular anatomy. RADIATION DOSE REDUCTION: This exam was performed according to the departmental dose-optimization program which includes automated exposure control, adjustment of the mA and/or kV according to patient size and/or use of iterative reconstruction technique. CONTRAST:  75mL OMNIPAQUE IOHEXOL 350 MG/ML SOLN COMPARISON:  CT chest, 09/15/2022, 6:31 a.m. FINDINGS: Cardiovascular: Satisfactory opacification of the pulmonary arteries to the segmental level. No evidence of pulmonary embolism. Normal heart size. Left coronary artery calcifications. No pericardial effusion.  Aortic atherosclerosis. Mediastinum/Nodes: No enlarged mediastinal, hilar, or axillary lymph nodes. Small calcified granulomatous left hilar lymph nodes, benign, requiring no further follow-up or characterization. Thyroid gland, trachea, and esophagus demonstrate no significant findings. Lungs/Pleura: Mild paraseptal emphysema. Bibasilar scarring and atelectasis, unchanged. Multiple spiculated appearing pulmonary nodules, largest in the posterior peripheral right upper lobe measuring 2.2 x 1.8 cm (series 7, image 45), as well as a small, cavitary nodule of the superior segment right lower lobe measuring 1.0 x 0.7 cm (series 7, image 52). No pleural effusion or pneumothorax. Upper Abdomen: No acute abnormality.  Coarse contour of the liver. Musculoskeletal: No chest wall abnormality. No acute osseous findings. Review of the MIP images confirms the above findings. IMPRESSION: 1. Negative examination for pulmonary embolism. 2. Multiple spiculated appearing pulmonary nodules, including a small, cavitary nodule of the superior segment right lower lobe. Findings remain highly concerning for pulmonary metastases versus septic emboli and not appreciably changed compared to prior same day CT. 3. Coarse contour of the liver, suggestive of cirrhosis. 4. Coronary artery disease. Aortic Atherosclerosis (ICD10-I70.0) and Emphysema (ICD10-J43.9). Electronically Signed   By: Jearld Lesch M.D.   On: 09/15/2022 13:38   CT CHEST WO CONTRAST  Result Date: 09/15/2022 CLINICAL DATA:  Abnormal x-ray, lung nodule EXAM: CT CHEST WITHOUT CONTRAST TECHNIQUE: Multidetector CT imaging of the chest was performed following the standard protocol without IV contrast. RADIATION DOSE REDUCTION: This exam was performed according to the departmental dose-optimization program which includes automated exposure control, adjustment of the mA and/or kV according to patient size and/or use of iterative reconstruction technique. COMPARISON:  Radiograph  from earlier today.  Chest CT 11/01/2005 FINDINGS: Cardiovascular: No significant vascular findings. Normal heart size. No pericardial effusion. Mild atheromatous calcification. Mediastinum/Nodes: No mass or adenopathy. Calcified left hilar lymph nodes attributed to prior granulomatous disease. Lungs/Pleura: Bilateral pulmonary nodules, the largest measuring 2 cm  in the right upper lobe with halo of ground-glass density and subjacent mild pleural thickening. In the left lung there is a lower lobe nodule with very early cavitation. Micronodular centrilobular nodules seen bilaterally, age indeterminate. No lobar consolidation, effusion, or air leak. Mild paraseptal emphysema. Calcified nodule in the left lung. Upper Abdomen: Large caudate lobe and fissures but no definite cirrhotic liver surface. Granulomatous calcification in the spleen. Musculoskeletal: Generalized degeneration without acute or focal finding. IMPRESSION: Irregular pulmonary nodules bilaterally with very early cavitation on the left. Septic emboli/hematogenous pneumonia is a leading consideration given chart history IV drug abuse. Metastatic disease could also have this pattern. Electronically Signed   By: Tiburcio Pea M.D.   On: 09/15/2022 06:54   DG Chest 2 View  Result Date: 09/15/2022 CLINICAL DATA:  Back pain worse with inspiration. EXAM: CHEST - 2 VIEW COMPARISON:  10/05/2018 FINDINGS: Low volume film. Interstitial markings are diffusely coarsened with chronic features. Interval development of a 15 mm nodular opacity over the right mid to upper lung. Scarring noted at both lung bases. The cardiopericardial silhouette is within normal limits for size. Defect in the posterior left sixth rib may be posttraumatic or from prior surgery. IMPRESSION: 1. Interval development of a 15 mm nodular opacity over the right mid to upper lung. CT chest without contrast recommended to further evaluate. 2. Chronic interstitial coarsening with bibasilar  scarring. Electronically Signed   By: Kennith Center M.D.   On: 09/15/2022 05:21    Pending Labs Unresulted Labs (From admission, onward)     Start     Ordered   09/22/22 0500  Creatinine, serum  (enoxaparin (LOVENOX)    CrCl >/= 30 ml/min)  Weekly,   R     Comments: while on enoxaparin therapy    09/15/22 1753   09/16/22 0500  Basic metabolic panel  Tomorrow morning,   R        09/15/22 1753   09/16/22 0500  CBC  Tomorrow morning,   R        09/15/22 1753   09/15/22 1723  Anti-DNA antibody, double-stranded  Once,   URGENT        09/15/22 1722   09/15/22 1723  Rheumatoid factor  Once,   URGENT        09/15/22 1722   09/15/22 1723  CYCLIC CITRUL PEPTIDE ANTIBODY, IGG/IGA  Once,   URGENT        09/15/22 1722   09/15/22 1723  Sjogrens syndrome-A extractable nuclear antibody  (Sjogren's Syndrome Antibods (SSA + SSB) (PNL))  Once,   URGENT        09/15/22 1722   09/15/22 1723  Sjogrens syndrome-B extractable nuclear antibody  (Sjogren's Syndrome Antibods (SSA + SSB) (PNL))  Once,   URGENT        09/15/22 1722   09/15/22 1723  Anti-scleroderma antibody  Once,   URGENT        09/15/22 1722   09/15/22 1723  Aldolase  Once,   URGENT        09/15/22 1722   09/15/22 1722  Legionella Pneumophila Serogp 1 Ur Ag  Once,   URGENT        09/15/22 1722   09/15/22 1722  Strep pneumoniae urinary antigen  Once,   URGENT        09/15/22 1722   09/15/22 1722  Respiratory (~20 pathogens) panel by PCR  (Respiratory panel by PCR (~20 pathogens, ~24 hr TAT)  w precautions)  Once,  URGENT        09/15/22 1722   09/15/22 1722  Procalcitonin  Once,   URGENT       References:    Procalcitonin Lower Respiratory Tract Infection AND Sepsis Procalcitonin Algorithm   09/15/22 1722   09/15/22 1722  Glomerular basement membrane antibodies  Once,   URGENT        09/15/22 1722   09/15/22 1722  ANA w/Reflex if Positive  Once,   URGENT        09/15/22 1722   09/15/22 1623  HCV Ab w Reflex to Quant PCR  Once,   URGENT         09/15/22 1622   09/15/22 1612  HIV Antibody (routine testing w rflx)  (HIV Antibody (Routine testing w reflex) panel)  Once,   URGENT        09/15/22 1611   09/15/22 1612  QuantiFERON-TB Gold Plus  Once,   URGENT        09/15/22 1611   09/15/22 1612  Sedimentation rate  Once,   URGENT        09/15/22 1611   09/15/22 1612  ANCA Titers  (Anti-Neutrophilic Cystoplasmic Antibody Panel (PNL))  Once,   URGENT        09/15/22 1611   09/15/22 1612  Acid Fast Smear (AFB)  (AFB smear + Culture w reflexed sensitivities panel)  Once,   URGENT       Placed in "And" Linked Group   09/15/22 1611   09/15/22 1612  Acid Fast Culture with reflexed sensitivities  (AFB smear + Culture w reflexed sensitivities panel)  Once,   URGENT       Placed in "And" Linked Group   09/15/22 1611   09/15/22 1504  Rapid urine drug screen (hospital performed)  ONCE - STAT,   STAT        09/15/22 1503   09/15/22 1008  Blood culture (routine x 2)  BLOOD CULTURE X 2,   R      09/15/22 1007            Vitals/Pain Today's Vitals   09/15/22 1700 09/15/22 1715 09/15/22 1730 09/15/22 1800  BP: 122/77  123/76 112/89  Pulse: 62 64 60 (!) 53  Resp: (!) 21 16 19 16   Temp:      TempSrc:      SpO2: 94% 94% 93% 90%  Weight:      Height:      PainSc:        Isolation Precautions Droplet precaution  Medications Medications  pantoprazole (PROTONIX) EC tablet 40 mg (has no administration in time range)  rOPINIRole (REQUIP) tablet 2 mg (has no administration in time range)  fluticasone furoate-vilanterol (BREO ELLIPTA) 100-25 MCG/ACT 1 puff (has no administration in time range)    And  umeclidinium bromide (INCRUSE ELLIPTA) 62.5 MCG/ACT 1 puff (has no administration in time range)  citalopram (CELEXA) tablet 40 mg (has no administration in time range)  amitriptyline (ELAVIL) tablet 75 mg (has no administration in time range)  traZODone (DESYREL) tablet 100-200 mg (has no administration in time range)   HYDROmorphone (DILAUDID) injection 0.5 mg (has no administration in time range)  enoxaparin (LOVENOX) injection 40 mg (has no administration in time range)  acetaminophen (TYLENOL) tablet 650 mg (has no administration in time range)  senna (SENOKOT) tablet 8.6 mg (has no administration in time range)  pregabalin (LYRICA) capsule 150 mg (has no administration in time range)  polyethylene glycol (MIRALAX /  GLYCOLAX) packet 17 g (has no administration in time range)  nicotine (NICODERM CQ - dosed in mg/24 hours) patch 21 mg (has no administration in time range)  ipratropium-albuterol (DUONEB) 0.5-2.5 (3) MG/3ML nebulizer solution 3 mL (has no administration in time range)  fentaNYL (SUBLIMAZE) injection 25 mcg (25 mcg Intravenous Given 09/15/22 1046)  ondansetron (ZOFRAN) injection 4 mg (4 mg Intravenous Given 09/15/22 1046)  iohexol (OMNIPAQUE) 350 MG/ML injection 75 mL (75 mLs Intravenous Contrast Given 09/15/22 1228)  morphine (PF) 2 MG/ML injection 2 mg (2 mg Intravenous Given 09/15/22 1245)  ondansetron (ZOFRAN) injection 4 mg (4 mg Intravenous Given 09/15/22 1245)  ceFEPIme (MAXIPIME) 2 g in sodium chloride 0.9 % 100 mL IVPB (0 g Intravenous Stopped 09/15/22 1612)  vancomycin (VANCOREADY) IVPB 2000 mg/400 mL (0 mg Intravenous Stopped 09/15/22 1828)    Mobility Walks however he may need some assistance at this time.  Has not walked for me on my shift      Focused Assessments Pulmonary Assessment Handoff:  Lung sounds:   O2 Device: Room Air      R Recommendations: See Admitting Provider Note  Report given to:   Additional Notes:

## 2022-09-15 NOTE — ED Triage Notes (Signed)
C/o upper back pain onset 2 days ago states he has a history of disc problems pain is worse with inspiration and movement.

## 2022-09-16 ENCOUNTER — Inpatient Hospital Stay (HOSPITAL_COMMUNITY): Payer: 59

## 2022-09-16 ENCOUNTER — Other Ambulatory Visit (HOSPITAL_COMMUNITY): Payer: 59

## 2022-09-16 DIAGNOSIS — R0781 Pleurodynia: Secondary | ICD-10-CM

## 2022-09-16 DIAGNOSIS — I38 Endocarditis, valve unspecified: Secondary | ICD-10-CM

## 2022-09-16 DIAGNOSIS — R918 Other nonspecific abnormal finding of lung field: Secondary | ICD-10-CM

## 2022-09-16 LAB — CBC
HCT: 36.5 % — ABNORMAL LOW (ref 39.0–52.0)
Hemoglobin: 11.3 g/dL — ABNORMAL LOW (ref 13.0–17.0)
MCH: 28.2 pg (ref 26.0–34.0)
MCHC: 31 g/dL (ref 30.0–36.0)
MCV: 91 fL (ref 80.0–100.0)
Platelets: 230 10*3/uL (ref 150–400)
RBC: 4.01 MIL/uL — ABNORMAL LOW (ref 4.22–5.81)
RDW: 14.2 % (ref 11.5–15.5)
WBC: 8.6 10*3/uL (ref 4.0–10.5)
nRBC: 0 % (ref 0.0–0.2)

## 2022-09-16 LAB — RESPIRATORY PANEL BY PCR

## 2022-09-16 LAB — MRSA NEXT GEN BY PCR, NASAL: MRSA by PCR Next Gen: NOT DETECTED

## 2022-09-16 LAB — RAPID URINE DRUG SCREEN, HOSP PERFORMED
Amphetamines: NOT DETECTED
Barbiturates: NOT DETECTED
Benzodiazepines: POSITIVE — AB
Cocaine: NOT DETECTED
Opiates: POSITIVE — AB
Tetrahydrocannabinol: NOT DETECTED

## 2022-09-16 LAB — BASIC METABOLIC PANEL
Anion gap: 12 (ref 5–15)
BUN: 12 mg/dL (ref 6–20)
CO2: 25 mmol/L (ref 22–32)
Calcium: 8.7 mg/dL — ABNORMAL LOW (ref 8.9–10.3)
Chloride: 101 mmol/L (ref 98–111)
Creatinine, Ser: 1.02 mg/dL (ref 0.61–1.24)
GFR, Estimated: >60 mL/min (ref >60)
Glucose, Bld: 123 mg/dL — ABNORMAL HIGH (ref 70–99)
Potassium: 4 mmol/L (ref 3.5–5.1)
Sodium: 138 mmol/L (ref 135–145)

## 2022-09-16 LAB — ECHOCARDIOGRAM COMPLETE
Area-P 1/2: 3.08 cm2
Height: 72 in
S' Lateral: 3.8 cm
Weight: 3633.18 [oz_av]

## 2022-09-16 LAB — HCV RT-PCR, QUANT (NON-GRAPH)
HCV log10: 5.65 {Log_IU}/mL
Hepatitis C Quantitation: 447000 [IU]/mL

## 2022-09-16 LAB — ALDOLASE: Aldolase: 5.5 U/L (ref 3.3–10.3)

## 2022-09-16 LAB — HCV AB W REFLEX TO QUANT PCR: HCV Ab: REACTIVE — AB

## 2022-09-16 MED ORDER — PERFLUTREN LIPID MICROSPHERE
1.0000 mL | INTRAVENOUS | Status: AC | PRN
  Administered 2022-09-16: 3 mL via INTRAVENOUS

## 2022-09-16 NOTE — Plan of Care (Signed)
  Problem: Activity: Goal: Risk for activity intolerance will decrease Outcome: Completed/Met

## 2022-09-16 NOTE — Plan of Care (Signed)

## 2022-09-16 NOTE — Hospital Course (Signed)
[name] is a 62 y.o.male with a history of COPD, LLL resection, presenting with right-sided pleuritic chest  and back pain. He was admitted to the [facility] Teaching [facility] at [facility] for workup of septic lung emboli vs metastatic cancer vs primary lung cancer. His hospital course is detailed below:  Septic Emboli  Presented with pleuritic chest pain and back pain. Collected labs were rather unremarkable with trops and lactic acid flat. Chest x-ray did show a right middle/upper lobe opacity with increased interstitial markings.  CT chest without contrast demonstrated irregular pulmonary nodules bilaterally with very early cavitation on the left and concerning septic emboli/hematogenous pneumonia versus metastatic disease.  CTA chest was concerning for pulmonary metastasis with a coarse contour of the liver suggestive of cirrhosis. Pulmonology recommended droplet precautions and labs which showed ***. cardiology recommended obtaining a TTE which showed ***.  He was started on cefepime and vancomycin per pharmacy (8/16-***). Blood cultures showed ***.   In order to treat patient's pain he was started out on Dilaudid 0.5 mg q4h as needed, and scheduled Tylenol 650 mg every 6 hours, Toradol 30 mg every 6 hours. *** Pain was controlled prior to discharge on p.o. pain medications.***   Other chronic conditions were medically managed with home medications and formulary alternatives as necessary (Tobacco Use Disorder) Tobacco use disorder: 1.5 PPD for ~ 45 years, Nicotine Patch ordered  Chronic pain: Pregabalin 150 mg TID, trazodone 100-200 mg PRN COPD: Duoneb Q6h PRN, Breo/Incruse Ellipta GERD: Pantoprazole 40 mg Depression, anxiety: Citalopram 40 mg, amitriptyline 75 mg   PCP Follow-up Recommendations:

## 2022-09-16 NOTE — Progress Notes (Signed)
Patient with increased drowsiness noted upon entering his room. Snoring. Upon waking up, patient requested for dilaudid. Patient educated on narcotic medication effects including drowsiness and lethargy that may interfere with breathing etc. Patient requested to speak with MD. Team paged.

## 2022-09-16 NOTE — Progress Notes (Signed)
Daily Progress Note Intern Pager: [phone]  Patient name: [name] Medical record number: [id number] Date of birth: 06-25-1960 Age: 62 y.o. Gender: male  Primary Care Provider: [facility] Consultants: Pulmonology and Cardiology  Code Status: Full Code  Pt Overview and Major Events to Date:  8/16 Admission, CT concerning of malignancy vs septic emboli, Pulm recs collecting labs, pain control  8/17 Follow up labs   Assessment and Plan:  [name] is a 62 y.o. male with PMHX of tobacco use disorder, COPD, depression/anxiety who was admitted for pleuritic chest and back pain. He was admitted to the family medicine service for imaging concerning for primary vs metastatic malignancy vs septic emboli. Continue to work on pain control and medication adjustments as need.. Pulmonology recs following up multiple labs and blood culture. Follow-up echo, if concerning consider cardiology consult.    [facility]     * (Principal) Septic embolism [facility])     Patient currently on 2L after desatting 88 last night, Sating well.  Continues to have some pain with deep breaths, Diuladid x1 overnight   Working up multiple etiologies. - Admit to FMTS, Progressive Care, Dr. McDiarmid attending - Cefepime and vancomycin daily per pharmacy, adjust based on workup - Blood Cx 8/16 results pending  - Cardiology and pulmonology on board, f/u recs, appreciate pulmonology  workup - Cardiac: f/u TTE - Respiratory: Droplet precautions, RPP, AFB, strep pneumo urine antigen - O2 goal 88-92%, only place oxygen if unable to maintain goal - Regular diet - Pain regimen: Dilaudid 0.5 mg IV Q4h PRN, acetaminophen 650 mg Q6h,  Toradol 30 mg Q6h - Bowel regimen: Senna and Miralax daily - AM BMP Cr 1.0 and CBC 11.3/36.5 WBC 8.6  - Follow-up respiratory panel        Pulmonary nodules     Abnormal CT scan of lung    Chronic and Stable Problems: Chronic pain:  Pregabalin 150 mg TID, trazodone 100-200 mg PRN COPD: Duoneb Q6h PRN, Breo/Incruse Ellipta GERD: Pantoprazole 40 mg Depression, anxiety: Citalopram 40 mg, amitriptyline 75 mg  FEN/GI: Regular Diet  PPx: Lovenox  Dispo:Home pending clinical improvement . Barriers include clinical stabilization, pain control and return of labs .   Subjective:  Pain somewhat improved this morning. Pain mostly occurs with deep breaths. Denies fevers, chills, nausea or emesis.   Objective: Temp:  [97.3 F (36.3 C)-98.4 F (36.9 C)] 98 F (36.7 C) (08/17 0400) Pulse Rate:  [53-74] 57 (08/17 0746) Resp:  [13-21] 16 (08/17 0746) BP: (98-144)/(54-97) 101/58 (08/17 0400) SpO2:  [90 %-98 %] 98 % (08/17 0746) Weight:  [227 lb 1.2 oz (103 kg)-227 lb 11.8 oz (103.3 kg)] 227 lb 1.2 oz (103 kg) (08/17 0600)  Physical Exam: General: Adult male, resting in bed Cardiovascular: RRR, no murmurs appreciated Respiratory: Normal work of breathing on 2L, satting well,  Abdomen: Non tender to palpation, soft Extremities: No lower extremity edema  Laboratory: Most recent CBC Lab Results  Component Value Date   WBC 8.6 09/16/2022   HGB 11.3 (L) 09/16/2022   HCT 36.5 (L) 09/16/2022   MCV 91.0 09/16/2022   PLT 230 09/16/2022   Most recent BMP    Latest Ref Rng & Units 09/16/2022    1:05 AM  BMP  Glucose 70 - 99 mg/dL 474   BUN 6 - 20 mg/dL 12   Creatinine 2.59 - 1.24 mg/dL 5.63   Sodium 875 -  145 mmol/L 138   Potassium 3.5 - 5.1 mmol/L 4.0   Chloride 98 - 111 mmol/L 101   CO2 22 - 32 mmol/L 25   Calcium 8.9 - 10.3 mg/dL 8.7      Imaging/Diagnostic Tests:  8/16:   - CXR: Right middle/upper lung opacity. Increased interstitial markings. - CT chest w/o contrast: Irregular pulmonary nodules bilaterally with very early cavitation on the left. Septic emboli/hematogenous pneumonia is a leading consideration versus metastatic disease. - CTA chest: Negative for PE, Multiple spiculated appearing pulmonary  nodules, including a small, cavitary nodule RLL.  Concerning for pulmonary metastases versus septic emboli.   Peterson Ao, MD 09/16/2022, 7:48 AM  PGY-1, [facility] Family Medicine FPTS Intern pager: [phone], text pages welcome Secure chat group [facility] Teaching Service

## 2022-09-16 NOTE — Assessment & Plan Note (Addendum)
Patient currently on 2L after desatting 88 last night, Sating well. Continues to have some pain with deep breaths, Diuladid x1 overnight  Working up multiple etiologies. - Admit to FMTS, Progressive Care, Dr. McDiarmid attending - Cefepime and vancomycin daily per pharmacy, adjust based on workup - Blood Cx 8/16 results pending  - Cardiology and pulmonology on board, f/u recs, appreciate pulmonology workup - Cardiac: f/u TTE - Respiratory: Droplet precautions, RPP, AFB, strep pneumo urine antigen - O2 goal 88-92%, only place oxygen if unable to maintain goal - Regular diet - Pain regimen: Dilaudid 0.5 mg IV Q4h PRN, acetaminophen 650 mg Q6h, Toradol 30 mg Q6h - Bowel regimen: Senna and Miralax daily - AM BMP Cr 1.0 and CBC 11.3/36.5 WBC 8.6  - Follow-up respiratory panel

## 2022-09-17 DIAGNOSIS — R768 Other specified abnormal immunological findings in serum: Secondary | ICD-10-CM | POA: Insufficient documentation

## 2022-09-17 LAB — CBC WITH DIFFERENTIAL/PLATELET
Abs Immature Granulocytes: 0.01 10*3/uL (ref 0.00–0.07)
Basophils Absolute: 0 10*3/uL (ref 0.0–0.1)
Basophils Relative: 0 %
Eosinophils Absolute: 0.2 10*3/uL (ref 0.0–0.5)
Eosinophils Relative: 3 %
HCT: 36.5 % — ABNORMAL LOW (ref 39.0–52.0)
Hemoglobin: 11.2 g/dL — ABNORMAL LOW (ref 13.0–17.0)
Immature Granulocytes: 0 %
Lymphocytes Relative: 21 %
Lymphs Abs: 1.6 10*3/uL (ref 0.7–4.0)
MCH: 29.3 pg (ref 26.0–34.0)
MCHC: 30.7 g/dL (ref 30.0–36.0)
MCV: 95.5 fL (ref 80.0–100.0)
Monocytes Absolute: 0.6 10*3/uL (ref 0.1–1.0)
Monocytes Relative: 8 %
Neutro Abs: 5 10*3/uL (ref 1.7–7.7)
Neutrophils Relative %: 68 %
Platelets: 215 10*3/uL (ref 150–400)
RBC: 3.82 MIL/uL — ABNORMAL LOW (ref 4.22–5.81)
RDW: 14.2 % (ref 11.5–15.5)
WBC: 7.4 10*3/uL (ref 4.0–10.5)
nRBC: 0 % (ref 0.0–0.2)

## 2022-09-17 LAB — BASIC METABOLIC PANEL
Anion gap: 9 (ref 5–15)
BUN: 19 mg/dL (ref 6–20)
CO2: 23 mmol/L (ref 22–32)
Calcium: 8.2 mg/dL — ABNORMAL LOW (ref 8.9–10.3)
Chloride: 103 mmol/L (ref 98–111)
Creatinine, Ser: 1.03 mg/dL (ref 0.61–1.24)
GFR, Estimated: >60 mL/min (ref >60)
Glucose, Bld: 95 mg/dL (ref 70–99)
Potassium: 4.3 mmol/L (ref 3.5–5.1)
Sodium: 135 mmol/L (ref 135–145)

## 2022-09-17 MED ORDER — SODIUM BICARBONATE 8.4 % IV SOLN: 50.0000 meq | Freq: Once | INTRAVENOUS | Status: DC

## 2022-09-17 NOTE — Assessment & Plan Note (Signed)
Unclear chronicity, but will likely require outpatient treatment.  CMP unremarkable on admission. - Discussed with patient concerning outpatient follow-up

## 2022-09-17 NOTE — Assessment & Plan Note (Signed)
Continuing to report pain, but controlled.  Dilaudid x2 overnight. - Pain regimen: Dilaudid 0.5 mg IV Q4h PRN, acetaminophen 650 mg Q6h, Toradol 30 mg Q6h - Bowel regimen: Senna and Miralax daily

## 2022-09-17 NOTE — Progress Notes (Signed)
Daily Progress Note Intern Pager: [phone]  Patient name: [name] Medical record number: [id number] Date of birth: 1960-02-21 Age: 62 y.o. Gender: male  Primary Care Provider: [facility] 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 male with a pertinent PMH of IVDU 1 year ago, significant smoking history, who presented with chest and back pain and was admitted for workup of newly found pulmonary nodules on CT, currently undergoing workup for autoimmune, infectious, malignant etiologies.  Patient may be in need of bronchoscopy, depending on results of pending workup. Assessment & Plan Pulmonary nodules Patient satting well on room air this morning.  Continues to have pleuritic pain with deep breaths.  Working up multiple etiologies as below. - Cefepime and vancomycin daily, can adjust Doxy/Augmentin if Cardiology clears tomorrow - Blood Cx 8/16 NGTD @ 2d - Follow-up Pulm recs tomorrow, discuss bronchoscopy versus outpatient management - Cardiac: TTE w/ poor windows on endocarditis, TEE needed to confirm, will f/u with Cards tomorrow - Respiratory: Droplet precautions, AFB, strep pneumo urine antigen pending - O2 goal 88-92%, only place oxygen if unable to maintain goal Septic embolism (HCC) Possible septic embolism, workup as per primary problem. Pleuritic chest pain Continuing to report pain, but controlled.  Dilaudid x2 overnight. - Pain regimen: Dilaudid 0.5 mg IV Q4h PRN, acetaminophen 650 mg Q6h, Toradol 30 mg Q6h - Bowel regimen: Senna and Miralax daily HCV antibody positive Unclear chronicity, but will likely require outpatient treatment.  CMP unremarkable on admission. - Discussed with patient concerning outpatient follow-up  Chronic and stable Chronic  pain: Pregabalin 150 mg TID, trazodone 100-200 mg PRN COPD: Duoneb Q6h PRN, Breo/Incruse Ellipta GERD: Pantoprazole 40 mg Depression, anxiety: Citalopram 40 mg, amitriptyline 75 mg  FEN/GI: Regular diet, no mIVF PPx: Lovenox Dispo: Home  pending workup . Barriers include investigation of new pulmonary nodules.  Subjective:  This morning, patient appears somewhat drowsy but is arousable.  States that he is feeling well and the pain is well-controlled, but is still continuing to feel pain with deep inspiration.  Inquires what is happening with his lung workup, discussed with patient the possibility of malignancy versus infectious etiology and stated that we do not know what the cause of his pain and nodules as yet.  Patient acknowledges information.  UDS resulted with benzos and opiates.  We have been giving patient opiates.  PDMP does not show benzos filled.  Unclear source.  Objective: Temp:  [97.5 F (36.4 C)-98.2 F (36.8 C)] 97.5 F (36.4 C) (08/18 0744) Pulse Rate:  [57-70] 67 (08/18 0335) Resp:  [16-20] 16 (08/18 0744) BP: (108-127)/(57-88) 124/76 (08/18 0744) SpO2:  [93 %-99 %] 99 % (08/18 0744)  Physical Exam: General: Age-appropriate, resting comfortably in chair, NAD, WNWD, alert and at baseline. Cardiovascular: Regular rate and rhythm. Normal S1/S2. No murmurs, rubs, or gallops appreciated. 2+ radial pulses. Pulmonary: Clear bilaterally to ascultation. No increased WOB, no accessory muscle usage on room air. No wheezes, rales, or crackles.  Difficulty inspiring deeply due to pain. Skin: Warm and dry. Extremities: No peripheral edema bilaterally.  Laboratory: Most recent CBC Lab Results  Component Value Date   WBC 7.4 09/17/2022   HGB 11.2 (L) 09/17/2022   HCT 36.5 (L) 09/17/2022   MCV 95.5 09/17/2022   PLT 215 09/17/2022  Most recent BMP    Latest Ref Rng & Units 09/17/2022    1:20 AM  BMP  Glucose 70 - 99 mg/dL 95   BUN 6 - 20 mg/dL 19   Creatinine 9.60 -  1.24 mg/dL 4.54   Sodium 098 - 119 mmol/L 135   Potassium 3.5 - 5.1 mmol/L 4.3   Chloride 98 - 111 mmol/L 103   CO2 22 - 32 mmol/L 23   Calcium 8.9 - 10.3 mg/dL 8.2     Other pertinent labs: - HCV antibody+ - RPP negative - UDS opiates (iatrogenic), benzos  New Imaging/Diagnostic Tests: - None  Jannessa Ogden, MD 09/17/2022, 9:14 AM [facility]  FMTS Intern pager: [phone], text pages welcome Secure chat group [facility] Teaching Service

## 2022-09-17 NOTE — Assessment & Plan Note (Signed)
Patient satting well on room air this morning.  Continues to have pleuritic pain with deep breaths.  Working up multiple etiologies as below. - Cefepime and vancomycin daily, can adjust Doxy/Augmentin if Cardiology clears tomorrow - Blood Cx 8/16 NGTD @ 2d - Follow-up Pulm recs tomorrow, discuss bronchoscopy versus outpatient management - Cardiac: TTE w/ poor windows on endocarditis, TEE needed to confirm, will f/u with Cards tomorrow - Respiratory: Droplet precautions, AFB, strep pneumo urine antigen pending - O2 goal 88-92%, only place oxygen if unable to maintain goal

## 2022-09-17 NOTE — Assessment & Plan Note (Addendum)
Possible septic embolism, workup as per primary problem.

## 2022-09-18 ENCOUNTER — Inpatient Hospital Stay (HOSPITAL_COMMUNITY): Payer: 59

## 2022-09-18 DIAGNOSIS — R918 Other nonspecific abnormal finding of lung field: Secondary | ICD-10-CM

## 2022-09-18 DIAGNOSIS — R0781 Pleurodynia: Secondary | ICD-10-CM | POA: Diagnosis not present

## 2022-09-18 LAB — ACID FAST SMEAR (AFB, MYCOBACTERIA): Acid Fast Smear: NEGATIVE

## 2022-09-18 LAB — ANCA TITERS
Atypical P-ANCA titer: <1:20 {titer}
C-ANCA: <1:20 {titer}
P-ANCA: <1:20 {titer}

## 2022-09-18 LAB — SJOGRENS SYNDROME-B EXTRACTABLE NUCLEAR ANTIBODY: SSB (La) (ENA) Antibody, IgG: <0.2 AI (ref 0.0–0.9)

## 2022-09-18 LAB — ANTI-DNA ANTIBODY, DOUBLE-STRANDED: ds DNA Ab: <1 [IU]/mL (ref 0–9)

## 2022-09-18 LAB — GLOMERULAR BASEMENT MEMBRANE ANTIBODIES: GBM Ab: <0.2 U (ref 0.0–0.9)

## 2022-09-18 LAB — QUANTIFERON-TB GOLD PLUS (RQFGPL)
QuantiFERON Mitogen Value: >10 [IU]/mL
QuantiFERON Nil Value: 0 [IU]/mL
QuantiFERON TB1 Ag Value: 0.02 [IU]/mL
QuantiFERON TB2 Ag Value: 0.01 [IU]/mL

## 2022-09-18 LAB — LEGIONELLA PNEUMOPHILA SEROGP 1 UR AG: L. pneumophila Serogp 1 Ur Ag: NEGATIVE

## 2022-09-18 LAB — ANA W/REFLEX IF POSITIVE: Anti Nuclear Antibody (ANA): NEGATIVE

## 2022-09-18 LAB — RHEUMATOID FACTOR: Rheumatoid fact SerPl-aCnc: 14.8 [IU]/mL — ABNORMAL HIGH (ref <14.0)

## 2022-09-18 LAB — QUANTIFERON-TB GOLD PLUS: QuantiFERON-TB Gold Plus: NEGATIVE

## 2022-09-18 LAB — ANTI-SCLERODERMA ANTIBODY: Scleroderma (Scl-70) (ENA) Antibody, IgG: <0.2 AI (ref 0.0–0.9)

## 2022-09-18 LAB — SJOGRENS SYNDROME-A EXTRACTABLE NUCLEAR ANTIBODY: SSA (Ro) (ENA) Antibody, IgG: <0.2 AI (ref 0.0–0.9)

## 2022-09-18 MED ORDER — OXYCODONE HCL 5 MG PO TABS
10.0000 mg | ORAL_TABLET | Freq: Four times a day (QID) | ORAL | Status: DC | PRN
  Administered 2022-09-18 – 2022-09-19 (×3): 10 mg via ORAL
  Filled 2022-09-18 (×3): qty 2

## 2022-09-18 MED ORDER — POLYETHYLENE GLYCOL 3350 17 G PO PACK
17.0000 g | PACK | Freq: Two times a day (BID) | ORAL | Status: DC
  Administered 2022-09-18 – 2022-09-19 (×2): 17 g via ORAL
  Filled 2022-09-18 (×2): qty 1

## 2022-09-18 MED ORDER — OXYCODONE HCL 5 MG PO TABS
5.0000 mg | ORAL_TABLET | Freq: Four times a day (QID) | ORAL | Status: DC | PRN
  Administered 2022-09-18: 5 mg via ORAL
  Filled 2022-09-18: qty 1

## 2022-09-18 MED ORDER — SENNA 8.6 MG PO TABS
1.0000 | ORAL_TABLET | Freq: Two times a day (BID) | ORAL | Status: DC
  Administered 2022-09-18 – 2022-09-19 (×2): 8.6 mg via ORAL
  Filled 2022-09-18 (×2): qty 1

## 2022-09-18 NOTE — Assessment & Plan Note (Addendum)
Continuing to report pain, but controlled.  Dilaudid x3 yesterday, 30 ME.  No BM since 8/15. - Pain regimen: Transition from Dilaudid to ME oxycodone 5 mg Q6h PRN, acetaminophen 650 mg Q6h, Toradol 30 mg Q6h - Bowel regimen: Increased scheduled Senna and Miralax to BID

## 2022-09-18 NOTE — TOC Initial Note (Addendum)
Transition of Care [facility]) - Initial/Assessment Note    Patient Details  Name: [name] MRN: [id number] Date of Birth: 1960/02/27  Transition of Care [facility]) CM/SW Contact:    Leone Haven, RN Phone Number: 09/18/2022, 4:00 PM  Clinical Narrative:                 From home with friends, has PCP and insurance on file, states has no HH services in place at this time or DME at home.  States a friend  will transport him home at [employer] and friends is support system, states gets medications from [facility] on [address]  Pta self ambulatory         Patient Goals and CMS Choice            Expected Discharge Plan and Services                                              Prior Living Arrangements/Services                       Activities of Daily Living Home Assistive Devices/Equipment: None ADL Screening (condition at time of admission) Patient's cognitive ability adequate to safely complete daily activities?: Yes Is the patient deaf or have difficulty hearing?: No Does the patient have difficulty seeing, even when wearing glasses/contacts?: No Does the patient have difficulty concentrating, remembering, or making decisions?: No Patient able to express need for assistance with ADLs?: Yes Does the patient have difficulty dressing or bathing?: No Independently performs ADLs?: Yes (appropriate for developmental age) Does the patient have difficulty walking or climbing stairs?: No Weakness of Legs: None Weakness of Arms/Hands: None  Permission Sought/Granted                  Emotional Assessment              Admission diagnosis:  Tobacco use disorder [F17.200] Pulmonary nodules [R91.8] Septic embolism (HCC) [I76] Abnormal CT scan of lung [R91.8] Substance use disorder [F19.90] Patient Active Problem List   Diagnosis Date Noted   HCV antibody positive 09/17/2022   Pleuritic chest pain 09/16/2022   Pulmonary nodules 09/15/2022    Abnormal CT scan of lung 09/15/2022   Septic embolism (HCC) 09/15/2022   IVDU (intravenous drug user) 07/19/2021   Major depressive disorder, recurrent, moderate (HCC) 07/19/2021   Polysubstance use disorder 07/19/2021   Sinus bradycardia 07/19/2021   Subclinical hyperthyroidism 07/19/2021   Abdominal aortic aneurysm without rupture (HCC) 01/30/2021   Chronic obstructive pulmonary disease, unspecified (HCC) 01/30/2021   Essential (primary) hypertension 01/30/2021   Gastro-esophageal reflux disease without esophagitis 01/30/2021   Generalized anxiety disorder 01/30/2021   Mixed hyperlipidemia 01/30/2021   Type 2 diabetes mellitus without complication (HCC) 01/30/2021   HEPATITIS C 04/09/2006   DEPRESSION 04/09/2006   DEGENERATIVE DISC DISEASE 04/09/2006   METHICILLIN RESISTANT STAPHYLOCOCCUS AUREUS INFECTION 04/05/2006   EMPYEMA 04/05/2006   PNEUMONIA, HX OF 04/05/2006   PCP:  [facility] Pharmacy:   [facility] [phone] - [place], [place] - [address] AT [facility] [address] [place] [address] Phone: [phone] Fax: [phone]     Social Determinants of Health (SDOH) Social History: SDOH Screenings   Food Insecurity: Patient Declined (09/15/2022)  Housing: Patient  Declined (09/15/2022)  Transportation Needs: Patient Declined (09/15/2022)  Utilities: Patient Declined (09/15/2022)  Tobacco Use: High Risk (09/15/2022)   SDOH Interventions:     Readmission Risk Interventions     No data to display

## 2022-09-18 NOTE — Assessment & Plan Note (Addendum)
Patient satting well on room air this morning.  Continues to have pleuritic pain with deep breaths.  Working up multiple etiologies as below. - Cefepime and vancomycin daily, can adjust Doxy/Augmentin if Cardiology clears after TEE - Blood Cx 8/16 NGTD @ 3d - Pulm will follow up outpatient - Cardiac: TTE w/ poor windows on endocarditis, TEE needed to confirm, scheduled 8/20 - Respiratory: Droplet precautions, AFB, strep pneumo urine antigen pending - O2 goal 88-92%, only place oxygen if unable to maintain goal

## 2022-09-18 NOTE — Progress Notes (Signed)
Daily Progress Note Intern Pager: [phone]  Patient name: [name] Medical record number: [id number] Date of birth: 1961-01-25 Age: 62 y.o. Gender: male  Primary Care Provider: [facility] 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 male with a pertinent PMH of IVDU 1 year ago and significant smoking history who presented with chest and back pain and was admitted for workup of new pulmonary nodules on CT, currently awaiting TEE per cardiology recs and with worsening hilar lymphadenopathy on CXR.  Endocarditis less likely given lack of leukocytosis and fevers, but will await conclusive TEE.  Concern for lung malignancy, in particular given worsening interstitial marking and hilar lymphadenopathy. Assessment & Plan Pulmonary nodules Patient satting well on room air this morning.  Continues to have pleuritic pain with deep breaths.  Working up multiple etiologies as below. - Cefepime and vancomycin daily, can adjust Doxy/Augmentin if Cardiology clears after TEE - Blood Cx 8/16 NGTD @ 3d - Pulm will follow up outpatient - Cardiac: TTE w/ poor windows on endocarditis, TEE needed to confirm, scheduled 8/20 - Respiratory: Droplet precautions, AFB, strep pneumo urine antigen pending - O2 goal 88-92%, only place oxygen if unable to maintain goal Pleuritic chest pain Continuing to report pain, but controlled.  Dilaudid x3 yesterday, 30 ME.  No BM since 8/15. - Pain regimen: Transition from Dilaudid to ME oxycodone 5 mg Q6h PRN, acetaminophen 650 mg Q6h, Toradol 30 mg Q6h - Bowel regimen: Increased scheduled Senna and Miralax to BID HCV antibody positive Unclear chronicity, but will likely require outpatient treatment.  CMP unremarkable on  admission. - Discussed with patient concerning outpatient follow-up Septic embolism (HCC) Possible septic embolism, workup as per primary problem.  Chronic and stable Chronic pain: Pregabalin 150 mg TID, trazodone 100-200 mg PRN COPD: Duoneb Q6h PRN, Breo/Incruse Ellipta GERD: Pantoprazole 40 mg Depression, anxiety: Citalopram 40 mg, amitriptyline 75 mg  FEN/GI: Regular diet, no mIVF PPx: Lovenox Dispo: Home  pending workup . Barriers include investigation of new pulmonary nodules and TEE.  Subjective:  This morning, patient reports that he has not had a bowel movement since the 15th.  Also notes that he does have continuing allergic pain chest pain which is better controlled but still notable.  Does ask for some more pain medicines but of note chart review shows that he is not asked for his Dilaudid between 6 PM and 6 AM in the past 24 hours.  Inquires about going home.  Objective: Temp:  [97.5 F (36.4 C)-98 F (36.7 C)] 97.6 F (36.4 C) (08/19 0502) Pulse Rate:  [69-71] 69 (08/19 0040) Resp:  [16-18] 18 (08/19 0502) BP: (124-149)/(75-90) 149/90 (08/19 0502) SpO2:  [92 %-100 %] 92 % (08/19 0502) Weight:  [102 kg] 102 kg (08/19 0502)  Physical Exam: General: Age-appropriate, resting comfortably in bed, NAD, alert and at baseline. Cardiovascular: Regular rate and rhythm. Normal S1/S2. No murmurs, rubs, or gallops appreciated. 2+ radial pulses. Pulmonary: Clear bilaterally to ascultation. No increased WOB, no accessory muscle usage on room air. No wheezes, rales, or crackles. Abdominal: Hypoactive bowel sounds, nondistended. No tenderness to deep or light palpation. No rebound or guarding. Skin: Warm and dry.  No rashes grossly. Extremities: No peripheral edema bilaterally.    No clubbing,  cyanosis, or splinter hemorrhages.  Laboratory: Most recent CBC Lab Results  Component Value Date   WBC 7.4 09/17/2022   HGB 11.2 (L) 09/17/2022   HCT 36.5 (L) 09/17/2022   MCV 95.5  09/17/2022   PLT 215 09/17/2022   Most recent BMP    Latest Ref Rng & Units 09/17/2022    1:20 AM  BMP  Glucose 70 - 99 mg/dL 95   BUN 6 - 20 mg/dL 19   Creatinine 1.61 - 1.24 mg/dL 0.96   Sodium 045 - 409 mmol/L 135   Potassium 3.5 - 5.1 mmol/L 4.3   Chloride 98 - 111 mmol/L 103   CO2 22 - 32 mmol/L 23   Calcium 8.9 - 10.3 mg/dL 8.2     Other pertinent labs: - None  New Imaging/Diagnostic Tests: - CXR 8/19: Worsening hilar lymphadenopathy compared to previous  Nelia Shi, MD 09/18/2022, 7:14 AM [facility]  FMTS Intern pager: [phone], text pages welcome Secure chat group [facility] Teaching Service

## 2022-09-18 NOTE — Assessment & Plan Note (Signed)
Possible septic embolism, workup as per primary problem.

## 2022-09-18 NOTE — Plan of Care (Signed)
  Problem: Education: Goal: Knowledge of General Education information will improve Description: Including pain rating scale, medication(s)/side effects and non-pharmacologic comfort measures Outcome: Progressing   Problem: Clinical Measurements: Goal: Will remain free from infection Outcome: Progressing   

## 2022-09-18 NOTE — Assessment & Plan Note (Signed)
Unclear chronicity, but will likely require outpatient treatment.  CMP unremarkable on admission. - Discussed with patient concerning outpatient follow-up

## 2022-09-18 NOTE — Progress Notes (Addendum)
NAME:  [name], MRN:  [id number], DOB:  Nov 15, 1960, LOS: 3 ADMISSION DATE:  09/15/2022, CONSULTATION DATE:  09/15/2022 REFERRING MD: Wallace Cullens - FMTS CHIEF COMPLAINT:  Cavitary lung nodules   History of Present Illness:  62 year old man with history of lingular lung abscess, remote IVDU, presents with 2 days of pleuritic thoracic pain, primarily over right scapula. Worse with cough and deep breath. Non-tender. 7/10. Steadily worsening, so he came to ED. Cough occasionally productive of bloody sputum for 2 weeks. No dyspnea. No chest pain. Denies fevers, chills, night sweats. Weight mostly stable, no unintentional loss. Decent energy and appetite. No hematuria. No abdominal pain or problems. Has seasonal allergies, but no sinus problems or nosebleeds.  Had chest pain in 2007, workup included percutaneous lung biopsy complicated by MRSA lung abscess. He underwent resection of the left lingula for this. Uncertain why lung biopsy was indicated. Around this time he had hematuria as well.  PET from 2009 shows reduction in size of LLL pleural mass with small central area of metabolic activity, but no evidence of lung cancer recurrence elsewhere on scan. CT was recommended for 3 month follow-up at that time, uncertain if it was ever done.  Family history notable for lung cancer in 47s in brother and sister.  Social history notable for remote IVDU, heavy smoking 1-2 ppd for lifetime, remote history of incarceration.  Pertinent Medical History:  Remote IV drug use ?Hepatitis C in chart Tobacco abuse, 2PPD x 6-7 months, prior to that 1PPD  Significant Hospital Events: Including procedures, antibiotic start and stop dates in addition to other pertinent events   8/16 Admit to family medicine teaching service. CTA Chest negative for PE, +multiple spiculated pulmonary nodules with small cavitary nodule of superior RLL, c/f pulmonary mets vs. Septic emboli 8/19 Plan for TEE today/tomorrow with Cards, r/o  vegetation.  Interim History / Subjective:  No significant events overnight Pleasant and talkative Sats acceptable on RA, no conversational dyspnea Endorses pain on R with deep inspiration No other complaints, wants a cigarette +Numbness in BLE, L thumb and index/middle fingers (chronic) NPO for possible TEE today, otherwise plan for tomorrow  Objective:  Blood pressure (!) 152/87, pulse (!) 50, temperature 97.6 F (36.4 C), temperature source Oral, resp. rate 18, height 6' (1.829 m), weight 102 kg, SpO2 93%.        Intake/Output Summary (Last 24 hours) at 09/18/2022 0951 Last data filed at 09/18/2022 0502 Gross per 24 hour  Intake 1520 ml  Output 2210 ml  Net -690 ml   Filed Weights   09/15/22 2004 09/16/22 0600 09/18/22 0502  Weight: 103.3 kg 103 kg 102 kg   Physical Examination: General: Chronically ill-appearing middle-aged man in NAD. Pleasant and talkative. HEENT: [place]/AT, anicteric sclera, PERRL, moist mucous membranes. Neuro: Awake, oriented x 4. Responds to verbal stimuli. Following commands consistently. Moves all 4 extremities spontaneously. +Numbness in BLE CV: RRR, no m/g/r. PULM: Breathing even and unlabored on RA. Lung fields clear in upper fields, diminished at bases bilaterally L > R. GI: Soft, nontender, nondistended. Normoactive bowel sounds. Extremities: No LE edema noted. Skin: Warm/dry, no rashes.  Resolved Hospital Problem List   Resolved Problems:   * No resolved hospital problems. *  Assessment & Plan:  Principal Problem:   Pulmonary nodules Active Problems:   Abnormal CT scan of lung   Septic embolism (HCC)   Pleuritic chest pain   HCV antibody positive  Bilateral spiculated pulmonary nodules Small cavitary nodule of superior RLL  History of COPD - not exacerbated Polysubstance use disorder - no recent IVDU HCV Ab+ Suspicion for pulmonary mets vs. septic emboli. Risk factors for TB include history of incarceration, but no constitutional  symptoms. Vasculitis is a possibility. IVDU, although remote, puts him at risk for bacteremia and endocarditis and thus hematogenous pneumonia. Malignancy is high on differential given smoking history and strong family history. Had a percutaneous lung biopsy in 2007, uncertain where these results are--he does not recall. ?Sarcoidosis. Aspergillosis less likely without eosinophilia. Broad lab workup per below, may need bronchoscopy +/- biopsy pending results. Needs TEE r/o valvular vegetations.  - Agree with TEE planned for 8/19 vs. 8/20, r/o valvular vegetation - If negative, may proceed with bronch/Bx as malignancy higher on differential - Autoimmune workup pending - F/u repeat CXR - Supplemental O2 support as needed for sat > 90% - Bronchodilators (Breo + Incruse, DuoNebs PRN) - Pulmonary hygiene - Empiric cefepime/vanc for now  PCCM will continue to follow along with you.  Signature:   Faythe Ghee  Pulmonary & Critical Care 09/18/22 9:52 AM  Please see [email or web address] for pager details.  From 7A-7P if no response, please call [phone] After hours, please call Pola Corn [phone]    Attending attestation: Mr. Belyeu is a 62 year old gentleman with a history of remote IV drug use, previous lingular abscess status post lingula resection who presented with right sided thoracic pain especially with breathing.  CT notable for large bilateral opacities concerning for masses versus lung abscess.  BP (!) 152/85 (BP Location: Left Arm)   Pulse 66   Temp 98 F (36.7 C) (Oral)   Resp 19   Ht 6' (1.829 m)   Wt 102 kg   SpO2 96%   BMI 30.50 kg/m  Middle-age man sitting up in a chair no acute distress [place]/AT, eyes anicteric Breathing comfortably on nasal cannula, CTAB. S1-S2, regular rate and rhythm Abdomen soft, nontender, nondistended No diffuse rashes Awake alert, answering questions appropriately  CRP 1.1 Anti-double-stranded DNA negative ANA negative RF 14.8,  minimally elevated ANCA's negative Anti-SSA and SSB negative Anti-SCL 70 negative Procalcitonin < 0.1  WBC 7.4 H/H11.2/36.5  AFB sputum: smear negative x 1  Quantiferon negative  Assessment  & plan: Pleuritic pain, bilateral lung masses. No cavitations per my review. Main differential diagnosis is infection vs malignancy.  No strong suspicion for TB based on radiographic appearance.  -Agree with TEE tomorrow -on TB airborne precautions - no strong evidence for this radiographically. Quanitferon is negative with 1 negative AFB smear.  Would recommend discontinuing precautions. -If no vegetation seen on TEE likely needs bronchoscopy with lung biopsy to evaluate for cancer versus infection.  Suspicion for typical infection with very low procalcitonin level.  Steffanie Dunn, DO 09/18/22 7:34 PM  Pulmonary & Critical Care  For contact information, see Amion. If no response to pager, please call PCCM consult pager. After hours, 7PM- 7AM, please call Elink.

## 2022-09-18 NOTE — Progress Notes (Signed)
Pharmacy Antibiotic Note  [name] is a 62 y.o. male admitted on 09/15/2022 with  potential septic emboli vs. metastatic lung disease.  Pt has a history of IVDU. CBC wnl, CRP 1.1, afebrile. Pharmacy has been consulted for vancomycin and cefepime dosing.  Plan is for TEE on 8/19 or 8/20.   Plan: Continue cefepime 2 g IV every 8 hours  Continue vancomycin 750 mg IV every 12 hours (eAUC 448)  BMP in am Monitor WBC, temperature, renal function, and cultures F/u abx plan after TEE, may need vancomycin levels  Height: 6' (182.9 cm) Weight: 102 kg (224 lb 13.9 oz) IBW/kg (Calculated) : 77.6  Temp (24hrs), Avg:97.7 F (36.5 C), Min:97.5 F (36.4 C), Max:98 F (36.7 C)  Recent Labs  Lab 09/15/22 1037 09/15/22 1250 09/16/22 0105 09/17/22 0120  WBC 9.7  --  8.6 7.4  CREATININE 1.17  --  1.02 1.03  LATICACIDVEN 0.7 0.7  --   --     Estimated Creatinine Clearance: 94.3 mL/min (by C-G formula based on SCr of 1.03 mg/dL).    Allergies  Allergen Reactions   Diovan Hct [Valsartan-Hydrochlorothiazide] Other (See Comments)    Hypotension  Dizziness    Norvasc [Amlodipine] Other (See Comments)    Hypotension Dizziness     Antimicrobials this admission: Vanc 8/16 >> Cefepime 8/16 >>   Dose adjustments this admission:   Microbiology results: 8/16 BCx: ngtd  Thank you for involving pharmacy in this patient's care.  Loura Back, PharmD, BCPS Clinical Pharmacist Clinical phone for 09/18/2022 is [phone] 09/18/2022 4:12 PM

## 2022-09-19 ENCOUNTER — Other Ambulatory Visit (HOSPITAL_COMMUNITY): Payer: Self-pay

## 2022-09-19 ENCOUNTER — Encounter (HOSPITAL_COMMUNITY)
Admission: EM | Disposition: A | Discharge status: Home / Self Care | Payer: Self-pay | Source: Home / Self Care | Attending: Family Medicine

## 2022-09-19 ENCOUNTER — Inpatient Hospital Stay (HOSPITAL_COMMUNITY): Payer: 59 | Admitting: Anesthesiology

## 2022-09-19 ENCOUNTER — Telehealth: Payer: Self-pay | Admitting: Critical Care Medicine

## 2022-09-19 ENCOUNTER — Inpatient Hospital Stay (HOSPITAL_COMMUNITY): Payer: 59

## 2022-09-19 DIAGNOSIS — R918 Other nonspecific abnormal finding of lung field: Secondary | ICD-10-CM | POA: Diagnosis not present

## 2022-09-19 DIAGNOSIS — I340 Nonrheumatic mitral (valve) insufficiency: Secondary | ICD-10-CM

## 2022-09-19 DIAGNOSIS — F17200 Nicotine dependence, unspecified, uncomplicated: Secondary | ICD-10-CM | POA: Diagnosis not present

## 2022-09-19 DIAGNOSIS — I10 Essential (primary) hypertension: Secondary | ICD-10-CM

## 2022-09-19 DIAGNOSIS — R001 Bradycardia, unspecified: Secondary | ICD-10-CM

## 2022-09-19 DIAGNOSIS — R0781 Pleurodynia: Secondary | ICD-10-CM | POA: Diagnosis not present

## 2022-09-19 DIAGNOSIS — F17210 Nicotine dependence, cigarettes, uncomplicated: Secondary | ICD-10-CM

## 2022-09-19 DIAGNOSIS — J449 Chronic obstructive pulmonary disease, unspecified: Secondary | ICD-10-CM

## 2022-09-19 HISTORY — PX: TEE WITHOUT CARDIOVERSION: SHX5443

## 2022-09-19 LAB — BASIC METABOLIC PANEL
Anion gap: 9 (ref 5–15)
BUN: 17 mg/dL (ref 6–20)
CO2: 23 mmol/L (ref 22–32)
Calcium: 8.1 mg/dL — ABNORMAL LOW (ref 8.9–10.3)
Chloride: 105 mmol/L (ref 98–111)
Creatinine, Ser: 0.92 mg/dL (ref 0.61–1.24)
GFR, Estimated: >60 mL/min (ref >60)
Glucose, Bld: 75 mg/dL (ref 70–99)
Potassium: 4.3 mmol/L (ref 3.5–5.1)
Sodium: 137 mmol/L (ref 135–145)

## 2022-09-19 LAB — CYCLIC CITRUL PEPTIDE ANTIBODY, IGG/IGA: CCP Antibodies IgG/IgA: 6 U (ref 0–19)

## 2022-09-19 LAB — ECHO TEE

## 2022-09-19 SURGERY — ECHOCARDIOGRAM, TRANSESOPHAGEAL
Anesthesia: Monitor Anesthesia Care

## 2022-09-19 MED ORDER — NAPROXEN 500 MG PO TABS
500.0000 mg | ORAL_TABLET | Freq: Three times a day (TID) | ORAL | 1 refills | Status: AC
  Filled 2022-09-19: qty 60, 20d supply, fill #0

## 2022-09-19 MED ORDER — AMOXICILLIN-POT CLAVULANATE 875-125 MG PO TABS
1.0000 | ORAL_TABLET | Freq: Two times a day (BID) | ORAL | 0 refills | Status: AC
  Filled 2022-09-19: qty 11, 6d supply, fill #0

## 2022-09-19 MED ORDER — PROPOFOL 10 MG/ML IV BOLUS
INTRAVENOUS | Status: DC | PRN
  Administered 2022-09-19: 80 mg via INTRAVENOUS

## 2022-09-19 MED ORDER — LIDOCAINE 2% (20 MG/ML) 5 ML SYRINGE
INTRAMUSCULAR | Status: DC | PRN
  Administered 2022-09-19: 20 mg via INTRAVENOUS

## 2022-09-19 MED ORDER — PROPOFOL 500 MG/50ML IV EMUL
INTRAVENOUS | Status: DC | PRN
  Administered 2022-09-19: 150 ug/kg/min via INTRAVENOUS

## 2022-09-19 MED ORDER — POLYETHYLENE GLYCOL 3350 17 GM/SCOOP PO POWD
17.0000 g | Freq: Two times a day (BID) | ORAL | 0 refills | Status: AC
  Filled 2022-09-19: qty 238, 7d supply, fill #0

## 2022-09-19 MED ORDER — SODIUM CHLORIDE 0.9 % IV SOLN: INTRAVENOUS | Status: DC

## 2022-09-19 MED ORDER — OXYCODONE HCL 10 MG PO TABS
10.0000 mg | ORAL_TABLET | Freq: Three times a day (TID) | ORAL | 0 refills | Status: AC
Start: 2022-09-19 — End: 2022-09-24
  Filled 2022-09-19: qty 15, 5d supply, fill #0

## 2022-09-19 MED ORDER — DOXYCYCLINE HYCLATE 100 MG PO TABS
100.0000 mg | ORAL_TABLET | Freq: Two times a day (BID) | ORAL | 0 refills | Status: AC
  Filled 2022-09-19: qty 11, 6d supply, fill #0

## 2022-09-19 MED ORDER — SENNA 8.6 MG PO TABS
1.0000 | ORAL_TABLET | Freq: Two times a day (BID) | ORAL | 0 refills | Status: AC
  Filled 2022-09-19: qty 120, 60d supply, fill #0

## 2022-09-19 NOTE — Care Management Important Message (Signed)
Important Message  Patient Details  Name: [name] MRN: [id number] Date of Birth: Dec 10, 1960   Medicare Important Message Given:  Yes     Renie Ora 09/19/2022, 11:05 AM

## 2022-09-19 NOTE — CV Procedure (Signed)
Brief TEE Note  LVEF 60-65% No LA/LAA thrombus or masses No evidence of endocarditis  For additional details see full report.  Transgastric images were not obtained due to patient report of dysphagia and barium swallow with narrowing at the distal esophagus.   Brenae Lasecki C. Duke Salvia, MD, [facility] 09/19/2022 2:06 PM

## 2022-09-19 NOTE — Progress Notes (Signed)
NAME:  [name], MRN:  [id number], DOB:  09/16/1960, LOS: 4 ADMISSION DATE:  09/15/2022, CONSULTATION DATE:  09/15/2022 REFERRING MD: Wallace Cullens - FMTS CHIEF COMPLAINT:  Cavitary lung nodules   History of Present Illness:  62 year old man with history of lingular lung abscess, remote IVDU, presents with 2 days of pleuritic thoracic pain, primarily over right scapula. Worse with cough and deep breath. Non-tender. 7/10. Steadily worsening, so he came to ED. Cough occasionally productive of bloody sputum for 2 weeks. No dyspnea. No chest pain. Denies fevers, chills, night sweats. Weight mostly stable, no unintentional loss. Decent energy and appetite. No hematuria. No abdominal pain or problems. Has seasonal allergies, but no sinus problems or nosebleeds.  Had chest pain in 2007, workup included percutaneous lung biopsy complicated by MRSA lung abscess. He underwent resection of the left lingula for this. Uncertain why lung biopsy was indicated. Around this time he had hematuria as well.  PET from 2009 shows reduction in size of LLL pleural mass with small central area of metabolic activity, but no evidence of lung cancer recurrence elsewhere on scan. CT was recommended for 3 month follow-up at that time, uncertain if it was ever done.  Family history notable for lung cancer in 16s in brother and sister.  Social history notable for remote IVDU, heavy smoking 1-2 ppd for lifetime, remote history of incarceration.  Pertinent Medical History:  Remote IV drug use ?Hepatitis C in chart Tobacco abuse, 2PPD x 6-7 months, prior to that 1PPD  Significant Hospital Events: Including procedures, antibiotic start and stop dates in addition to other pertinent events   8/16 Admit to family medicine teaching service. CTA Chest negative for PE, +multiple spiculated pulmonary nodules with small cavitary nodule of superior RLL, c/f pulmonary mets vs. Septic emboli 8/19 Plan for TEE today/tomorrow with Cards, r/o  vegetation.  Interim History / Subjective:  He denies complaints, less coughing up blood.   Objective:  Blood pressure (!) 158/83, pulse (!) 54, temperature 98 F (36.7 C), temperature source Oral, resp. rate 16, height 6' (1.829 m), weight 103 kg, SpO2 98%.        Intake/Output Summary (Last 24 hours) at 09/19/2022 1557 Last data filed at 09/19/2022 1406 Gross per 24 hour  Intake 2339.88 ml  Output --  Net 2339.88 ml   Filed Weights   09/16/22 0600 09/18/22 0502 09/19/22 1050  Weight: 103 kg 102 kg 103 kg   Physical Examination: General: Middle-age man sitting up in bed no acute distress HEENT: /AT, eyes anicteric Neuro: Awake and alert, answering questions appropriately  CV: S1-S2, regular rate and rhythm PULM: Comfortable on room air, CTAB. GI: Soft, nontender, nondistended Extremities: No peripheral edema Skin: No diffuse rashes  CRP 1.1 Anti-double-stranded DNA negative ANA negative RF 14.8, minimally elevated ANCA's negative Anti-SSA and SSB negative Anti-SCL 70 negative Procalcitonin < 0.1  Resolved Hospital Problem List    Assessment & Plan:  Principal Problem:   Pulmonary nodules Active Problems:   Abnormal CT scan of lung   Septic embolism (HCC)   Pleuritic chest pain   HCV antibody positive   Lung mass  Bilateral spiculated pulmonary nodules; serology is not suggestive of autoimmune cause, so far TB workup has not been convincing either.  Concern for malignancy. Family history of lung cancer History of COPD - not exacerbated Polysubstance use disorder - no recent IVDU HCV Ab+ -TEE performed today, no evidence of endocarditis - Recommend outpatient bronchoscopy, navigation or robotic preferred due to peripheral  nodules to increase diagnostic yield.  Discussed with Dr. Tonia Brooms who could likely do his case next week.  Reviewed this plan with the patient including my concern that he could potentially have malignancy, especially concerning with his  smoking history and family history.  He agreed to undergo bronchoscopy as an outpatient.  Not on anticoagulant or antiplatelet medications.   Signature:  Steffanie Dunn, DO 09/19/22 6:09 PM [facility]  For contact information, see Amion. If no response to pager, please call PCCM consult pager. After hours, 7PM- 7AM, please call Elink.

## 2022-09-19 NOTE — Plan of Care (Signed)

## 2022-09-19 NOTE — Assessment & Plan Note (Signed)
Unclear chronicity, but will likely require outpatient treatment.  CMP unremarkable on admission. - Discussed with patient concerning outpatient follow-up

## 2022-09-19 NOTE — Progress Notes (Signed)
  Echocardiogram Echocardiogram Transesophageal has been performed.  Janalyn Harder 09/19/2022, 2:14 PM

## 2022-09-19 NOTE — Plan of Care (Signed)
  Problem: Education: Goal: Knowledge of General Education information will improve Description: Including pain rating scale, medication(s)/side effects and non-pharmacologic comfort measures 09/19/2022 0702 by Charlynn Grimes, RN Outcome: Progressing 09/19/2022 0701 by Charlynn Grimes, RN Outcome: Progressing   Problem: Health Behavior/Discharge Planning: Goal: Ability to manage health-related needs will improve 09/19/2022 0702 by Charlynn Grimes, RN Outcome: Progressing 09/19/2022 0701 by Charlynn Grimes, RN Outcome: Progressing   Problem: Clinical Measurements: Goal: Ability to maintain clinical measurements within normal limits will improve Outcome: Progressing Goal: Diagnostic test results will improve Outcome: Progressing

## 2022-09-19 NOTE — Progress Notes (Signed)
Daily Progress Note Intern Pager: [phone]  Patient name: [name] Medical record number: [id number] Date of birth: 1960/08/12 Age: 62 y.o. Gender: male  Primary Care Provider: [facility] Consultants: Cardiology, Pulmonology Code Status: FULL  Pt Overview and Major Events to Date:  8/16 - Admitted, workup started 8/17 - HCV positive, continuing workup 8/20 - TEE   Assessment and Plan: [name] is a 62 y.o. male with a pertinent PMH of IVDU 1 year ago and significant smoking history who presented with chest and back pain and was admitted for workup of new pulmonary nodules on CT. Endocarditis less likely given lack of leukocytosis and fevers, but will await conclusive TEE.  Concern for lung malignancy, in particular given worsening interstitial marking and hilar lymphadenopathy.   If no endocarditis, can be discharged on oral meds with outpatient Pulm follow up.  Assessment & Plan Pulmonary nodules Patient satting well on room air this morning.  Continues to have pleuritic pain with deep breaths.  Working up multiple etiologies as below. - Cefepime and vancomycin daily, can adjust Doxy/Augmentin PO if Cardiology clears after TEE - Blood Cx 8/16 NGTD @ 3d - Pulm will follow up outpatient - Cardiac: TTE w/ poor windows on endocarditis, TEE this morning - Respiratory: Droplet precautions, AFB, strep pneumo urine antigen pending - O2 goal 88-92%, only place oxygen if unable to maintain goal Pleuritic chest pain Continuing to report pain, but controlled.  Dilaudid x3 yesterday, 30 ME.  No BM since 8/15. - Pain regimen: Transition from Dilaudid to ME oxycodone 5 mg Q6h PRN, acetaminophen 650 mg Q6h, Toradol 30 mg Q6h - Bowel regimen: Increased scheduled Senna and Miralax to BID HCV antibody positive Unclear chronicity, but will likely require outpatient treatment.  CMP unremarkable on admission. - Discussed with patient concerning  outpatient follow-up Septic embolism (HCC) Possible septic embolism, workup as per primary problem.  Chronic and Stable Problems: Chronic pain: Pregabalin 150 mg TID, trazodone 100-200 mg PRN COPD: Duoneb Q6h PRN, Breo/Incruse Ellipta GERD: Pantoprazole 40 mg Depression, anxiety: Citalopram 40 mg, amitriptyline 75 mg  FEN/GI: Regular diet, no mIVF PPx: Lovenox Dispo: Home  pending workup . Barriers include investigation of new pulmonary nodules and TEE.  Subjective:  This morning, patient's pleuritic pain is adequately controlled on oral oxycodone.  He requested 42.5 ME yesterday.  Denies shortness of breath or palpitations, feels "okay" overall.  Ready to go home and asks again about the results of his labs and workup.  Appreciates direct answer that he may have lung cancer, but is aware that it is uncertain at this time prior to a bronchoscopy.  Objective: Temp:  [97.6 F (36.4 C)-98.2 F (36.8 C)] 98 F (36.7 C) (08/20 1404) Pulse Rate:  [50-70] 50 (08/20 1424) Resp:  [12-19] 12 (08/20 1424) BP: (105-158)/(53-97) 147/83 (08/20 1424) SpO2:  [94 %-98 %] 96 % (08/20 1424) Weight:  [103 kg] 103 kg (08/20 1050)  Physical Exam: General: Adult male resting in bed, no acute distress. Eyes: PERRLA. ENTM: MMM, poor dentition. Neck: No lymphadenopathy Cardiovascular: Intermittently bradycardic, regular rhythm, normal S1/S2.  No murmur/rub/gallops.  2+ radial pulses bilaterally. Respiratory: Normal work of breathing on room air.  No crackles/wheezes. Gastrointestinal: Nontender palpation, nondistended.  Hypoactive bowel sounds. MSK: Moving all extremities appropriate. Derm: No rashes grossly. Neuro: Alert and oriented x 4.  No focal neurological deficit. Psych: Pleasant, appropriate.  Makes some jokes.  Full range affect.  Laboratory: Most recent  CBC Lab Results  Component Value Date   WBC 7.4 09/17/2022   HGB 11.2 (L) 09/17/2022   HCT 36.5 (L) 09/17/2022   MCV 95.5 09/17/2022    PLT 215 09/17/2022   Most recent BMP    Latest Ref Rng & Units 09/19/2022    2:03 AM  BMP  Glucose 70 - 99 mg/dL 75   BUN 6 - 20 mg/dL 17   Creatinine 6.30 - 1.24 mg/dL 1.60   Sodium 109 - 323 mmol/L 137   Potassium 3.5 - 5.1 mmol/L 4.3   Chloride 98 - 111 mmol/L 105   CO2 22 - 32 mmol/L 23   Calcium 8.9 - 10.3 mg/dL 8.1     Other pertinent labs: - Legionella Ag: Negative - Quant Gold: Negative - AFB: Negative - Scl-70, SSB, SSA, dsDNA Ab, P-ANCA, C-ANCA, ANA: Negative  New Imaging/Diagnostic Tests: - TEE: Results pending  Dwayne Gonce, MD 09/19/2022, 2:45 PM [facility]  FMTS Intern pager: [phone], text pages welcome Secure chat group [facility] Teaching Service

## 2022-09-19 NOTE — H&P (Signed)
[name] is a 62 y.o. male who has presented today for surgery, with the diagnosis of pulmonary nodules.  The various methods of treatment have been discussed with the patient and family. After consideration of risks, benefits and other options for treatment, the patient has consented to  Procedure(s): TRANSESOPHAGEAL ECHOCARDIOGRAM (TEE) (N/A) as a surgical intervention .  The patient's history has been reviewed, patient examined, no change in status, stable for surgery.  I have reviewed the patient's chart and labs.  Questions were answered to the patient's satisfaction.    [name] C. Duke Salvia, MD, [facility]  09/19/2022 1:35 PM

## 2022-09-19 NOTE — Anesthesia Preprocedure Evaluation (Addendum)
Anesthesia Evaluation  Patient identified by MRN, date of birth, ID band Patient awake    Reviewed: Allergy & Precautions, NPO status , Patient's Chart, lab work & pertinent test results  History of Anesthesia Complications Negative for: history of anesthetic complications  Airway Mallampati: III  TM Distance: >3 FB Neck ROM: Full    Dental  (+) Dental Advisory Given   Pulmonary neg shortness of breath, neg sleep apnea, COPD,  COPD inhaler, neg recent URI, Current Smoker and Patient abstained from smoking. Lung mass   Pulmonary exam normal breath sounds clear to auscultation       Cardiovascular hypertension, (-) angina (-) Past MI, (-) Cardiac Stents and (-) CABG (-) dysrhythmias  Rhythm:Regular Rate:Normal  AAA, HLD  TTE 09/16/2022: IMPRESSIONS     1. Poor windows for TTE. Poor visualization of valves. Consider TEE if  there are concerns for endocarditis.   2. Left ventricular ejection fraction, by estimation, is 60 to 65%. The  left ventricle has normal function. The left ventricle has no regional  wall motion abnormalities. Left ventricular diastolic function could not  be evaluated.   3. Right ventricular systolic function is normal. The right ventricular  size is normal. Tricuspid regurgitation signal is inadequate for assessing  PA pressure.   4. The mitral valve was not well visualized. Trivial mitral valve  regurgitation. No evidence of mitral stenosis.   5. The aortic valve was not well visualized. Aortic valve regurgitation  is not visualized. No aortic stenosis is present.   6. The inferior vena cava is dilated in size with >50% respiratory  variability, suggesting right atrial pressure of 8 mmHg.     Neuro/Psych neg Seizures PSYCHIATRIC DISORDERS Anxiety Depression    negative neurological ROS     GI/Hepatic ,GERD  Medicated,,(+)     substance abuse  IV drug use, Hepatitis -, C  Endo/Other  diabetes,  Type 2 Hyperthyroidism   Renal/GU negative Renal ROS     Musculoskeletal  (+) Arthritis ,    Abdominal  (+) + obese  Peds  Hematology negative hematology ROS (+) Lab Results      Component                Value               Date                      WBC                      7.4                 09/17/2022                HGB                      11.2 (L)            09/17/2022                HCT                      36.5 (L)            09/17/2022                MCV                      95.5  09/17/2022                PLT                      215                 09/17/2022              Anesthesia Other Findings 62 y.o.male with a history of COPD, LLL resection, presenting with right-sided pleuritic chest  and back pain. He was admitted to the [facility] Medicine Teaching Service at [facility] for workup of septic lung emboli vs metastatic cancer vs primary lung cancer.   Reproductive/Obstetrics                             Anesthesia Physical Anesthesia Plan  ASA: 4  Anesthesia Plan: MAC   Post-op Pain Management: Minimal or no pain anticipated   Induction: Intravenous  PONV Risk Score and Plan: 0 and Propofol infusion and Treatment may vary due to age or medical condition  Airway Management Planned: Natural Airway and Nasal Cannula  Additional Equipment:   Intra-op Plan:   Post-operative Plan:   Informed Consent: I have reviewed the patients History and Physical, chart, labs and discussed the procedure including the risks, benefits and alternatives for the proposed anesthesia with the patient or authorized representative who has indicated his/her understanding and acceptance.     Dental advisory given  Plan Discussed with: Anesthesiologist and CRNA  Anesthesia Plan Comments: (Discussed with patient risks of MAC including, but not limited to, minor pain or discomfort, hearing people in the room, and possible need for backup general  anesthesia. Risks for general anesthesia also discussed including, but not limited to, sore throat, hoarse voice, chipped/damaged teeth, injury to vocal cords, nausea and vomiting, allergic reactions, lung infection, heart attack, stroke, and death. All questions answered. )        Anesthesia Quick Evaluation

## 2022-09-19 NOTE — Discharge Summary (Cosign Needed Addendum)
Family Medicine Teaching [facility] Discharge Summary  Patient name: [name] Medical record number: [id number] Date of birth: Oct 04, 1960 Age: 62 y.o. Gender: male Date of Admission: 09/15/2022  Date of Discharge: 09/19/2022 Admitting Physician: Nelia Shi, MD  Primary Care Provider: [facility] Consultants: Cardiology, Pulmonology  Indication for Hospitalization: Poorly controlled pain, new pulmonary nodules  Discharge Diagnoses/Problem List:  Principal Problem for Admission: New pulmonary nodules Other Problems addressed during stay:  Principal Problem:   Pulmonary nodules Active Problems:   Abnormal CT scan of lung   Septic embolism (HCC)   Pleuritic chest pain   HCV antibody positive   Lung mass  Brief Hospital Course:  [name] is a 62 y.o.male with a history of COPD, LLL resection, presenting with right-sided pleuritic chest  and back pain. He was admitted to the [facility] Teaching Service at [facility] for workup of septic lung emboli vs metastatic cancer vs primary lung cancer. His hospital course is detailed below:  Pulmonary nodules on CT Presented with pleuritic chest pain and back pain.  Troponin and lactic acid unremarkable.  CXR revealed a right middle/upper lobe opacity with increased interstitial markings.  CT chest without contrast demonstrated irregular pulmonary nodules bilaterally with very early cavitation on the left concerning for septic emboli/hematogenous pneumonia versus metastatic disease.  CTA chest was concerning for pulmonary metastasis with a coarse contour of the liver suggestive of cirrhosis.  Pulmonology consulted and obtained labs which showed negative autoimmune workup for scleroderma, SLE, sarcoidosis, vasculitides, as well as infectious etiologies such as TB and legionella. He was started on cefepime and vancomycin empirically per pharmacy (8/16-8/20) for possible endocarditis with septic  embolism.  Blood cultures obtained and showed NG at 4 days.  Obtained a TTE to evaluate for endocarditis which unfortunately had poor windows and Cardiology performed TEE, which showed no evidence of endocarditis, no LA/LAA thrombus, and EF 60-65%. Patient was discharged on oral Augmentin 875-125 mg twice daily for 5 days and oral doxycycline 100 mg twice daily for 5 days (8/20-8/25) with outpatient Pulmonology follow up for possible bronchoscopy.  Pleuritic chest pain Pain was controlled prior to discharge on oral pain medications with home oxycodone 10 mg Q8h for 6 days and naproxen 500 mg TID for 40 days.  Outpatient follow up for further oral opiates recommended.  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 Needs close follow up with Pulmonology for likely bronchoscopy in setting of new pulmonary nodules Discuss smoking cessation, patient states he is open to quitting with this possible cancer concern  Disposition: Home  Discharge Condition: Stable, on oral opiates for 5 days of pain control  Discharge Exam:  Vitals:   09/19/22 1424 09/19/22 1450  BP: (!) 147/83 (!) 158/83  Pulse: (!) 50 (!) 54  Resp: 12 16  Temp:  98 F (36.7 C)  SpO2: 96% 98%   Physical Exam: General: Adult male resting in bed, no acute distress. Eyes: PERRLA. ENTM: MMM, poor dentition. Neck: No lymphadenopathy Cardiovascular: Intermittently bradycardic, regular rhythm, normal S1/S2.  No murmur/rub/gallops.  2+ radial  pulses  bilaterally. Respiratory: Normal work of breathing on room air.  No crackles/wheezes. Gastrointestinal: Nontender palpation, nondistended.  Hypoactive bowel sounds. MSK: Moving all extremities appropriate. Derm: No rashes grossly. Neuro: Alert and oriented x 4.  No focal neurological deficit. Psych: Pleasant, appropriate.  Makes some jokes.  Full range affect.  Significant Procedures: TEE 8/20: LVEF 60-65%.  No LA/LAA thrombus or masses.  No evidence of endocarditis.  Significant Labs and Imaging:  No results for input(s): "WBC", "HGB", "HCT", "PLT" in the last 48 hours. Recent Labs  Lab 09/19/22 0203  NA 137  K 4.3  CL 105  CO2 23  GLUCOSE 75  BUN 17  CREATININE 0.92  CALCIUM 8.1*   - Trops flat (4>4) - Lactate WNL (0.7>0.7) - HCV antibody: Positive - RPP: Negative - UDS: Opiates (iatrogenic), benzos - Legionella Ag: Negative - Quant Gold: Negative - AFB: Negative - Scl-70, SSB, SSA, dsDNA Ab, P-ANCA, C-ANCA, ANA: Negative  - CXR 8/19: Worsening hilar lymphadenopathy compared to previous - CXR 8/17: Right middle/upper lung opacity. Increased interstitial markings. - CT chest w/o contrast: Irregular pulmonary nodules bilaterally with very early cavitation on the left. Septic emboli/hematogenous pneumonia is a leading consideration versus metastatic disease. - CTA chest: Negative for PE, Multiple spiculated appearing pulmonary nodules, including a small, cavitary nodule RLL.  Concerning for pulmonary metastases versus septic emboli.  Results/Tests Pending at Time of Discharge: - Cyclic citrul peptide Ab, AFB culture, blood Cx 8/16  Discharge Medications:  Allergies as of 09/19/2022       Reactions   Diovan Hct [valsartan-hydrochlorothiazide] Other (See Comments)   Hypotension  Dizziness    Norvasc [amlodipine] Other (See Comments)   Hypotension Dizziness         Medication List     STOP taking these medications    acetaminophen 500 MG tablet Commonly known  as: TYLENOL   HYDROcodone-acetaminophen 10-325 MG tablet Commonly known as: NORCO       TAKE these medications    albuterol 108 (90 Base) MCG/ACT inhaler Commonly known as: VENTOLIN HFA Inhale into the lungs.   amitriptyline 75 MG tablet Commonly known as: ELAVIL Take 75 mg by mouth at bedtime.   amoxicillin-clavulanate 875-125 MG tablet Commonly known as: AUGMENTIN Take 1 tablet by mouth 2 (two) times daily.   citalopram 40 MG tablet Commonly known as: CELEXA Take 40 mg by mouth at bedtime.   doxycycline 100 MG tablet Commonly known as: VIBRA-TABS Take 1 tablet (100 mg total) by mouth 2 (two) times daily.   naproxen 500 MG tablet Commonly known as: Naprosyn Take 1 tablet (500 mg total) by mouth 3 (three) times daily with meals.   omeprazole 40 MG capsule Commonly known as: PRILOSEC TAKE 1 CAPSULE BY MOUTH TWICE DAILY. TAKE FIRST DOSE 30 MINS PRIOR TO MORNINGMEAL.   Oxycodone HCl 10 MG Tabs Take 1 tablet (10 mg total) by mouth 3 (three) times daily for 5 days.   polyethylene glycol powder 17 GM/SCOOP powder Commonly known as: GLYCOLAX/MIRALAX Take 17 g by mouth 2 (two) times daily.   pregabalin 150 MG capsule Commonly known as: LYRICA Take 150 mg by mouth 3 (three) times daily.   rOPINIRole 2 MG tablet Commonly known as: REQUIP Take 2 mg by mouth at bedtime.   senna 8.6 MG Tabs tablet Commonly known as: SENOKOT Take 1 tablet (8.6 mg total) by mouth 2 (two) times daily.   traZODone 100 MG tablet Commonly known as: DESYREL Take 100-200 mg by mouth at  bedtime as needed for sleep.   Trelegy Ellipta 100-62.5-25 MCG/ACT Aepb Generic drug: Fluticasone-Umeclidin-Vilant Inhale 1 puff into the lungs daily as needed (shortness of breath).       Discharge Instructions: Please refer to Patient Instructions section of EMR for full details.  Patient was counseled important signs and symptoms that should prompt return to medical care, changes in medications,  dietary instructions, activity restrictions, and follow up appointments.   Follow-Up Appointments: Pulmonology w/ Dr. Delton Coombes 10/10/22  Sharion Dove, Dimitry, MD 09/19/2022, 5:39 PM PGY-1, [facility] Family Medicine  I was personally present and performed medical decision making activities of this service and have verified that the service and findings are accurately documented in the resident's note.  Shelby Mattocks, DO                  09/19/2022, 5:39 PM

## 2022-09-19 NOTE — Assessment & Plan Note (Signed)
Patient satting well on room air this morning.  Continues to have pleuritic pain with deep breaths.  Working up multiple etiologies as below. - Cefepime and vancomycin daily, can adjust Doxy/Augmentin PO if Cardiology clears after TEE - Blood Cx 8/16 NGTD @ 3d - Pulm will follow up outpatient - Cardiac: TTE w/ poor windows on endocarditis, TEE this morning - Respiratory: Droplet precautions, AFB, strep pneumo urine antigen pending - O2 goal 88-92%, only place oxygen if unable to maintain goal

## 2022-09-19 NOTE — Progress Notes (Signed)
SLP Cancellation Note  Patient Details Name: [name] MRN: [id number] DOB: 02/12/1960   Cancelled treatment:       Reason Eval/Treat Not Completed: Other (comment) Order received for SLP swallow evaluation due to patient c/o prior throat surgery and trouble with swallowing with food getting stuck. Esophagram completed 09/18/22 reported moderate esophageal dysmotility with presence of tertiary contractions and tortuosity, small hiatal hernia, smooth tapered narrowing in distal esophagus, mild gastroesophageal reflux, no aspiration or penetration of barium.  Patient is NPO for TEE planned today at 1300. SLP will follow up when patient able to take PO's.   Angela Nevin, MA, CCC-SLP Speech Therapy

## 2022-09-19 NOTE — TOC Benefit Eligibility Note (Signed)
Patient Product/process development scientist completed.    The patient is insured through [facility]. Patient has Medicare and is not eligible for a copay card, but may be able to apply for patient assistance, if available.    Ran test claim for Eliquis 5 mg and the current 30 day co-pay is $0.00.   This test claim was processed through [facility]- copay amounts may vary at other pharmacies due to pharmacy/plan contracts, or as the patient moves through the different stages of their insurance plan.     Roland Earl, CPHT Pharmacy Technician III Certified Patient Advocate [facility] Pharmacy Patient Advocate Team Direct Number: [phone]  Fax: [phone]

## 2022-09-19 NOTE — Assessment & Plan Note (Signed)
Possible septic embolism, workup as per primary problem.

## 2022-09-19 NOTE — Transfer of Care (Signed)
Immediate Anesthesia Transfer of Care Note  Patient: [name]  Procedure(s) Performed: TRANSESOPHAGEAL ECHOCARDIOGRAM  Patient Location: Cath Lab  Anesthesia Type:MAC  Level of Consciousness: drowsy, patient cooperative, and responds to stimulation  Airway & Oxygen Therapy: Patient Spontanous Breathing and Patient connected to nasal cannula oxygen  Post-op Assessment: Report given to RN, Post -op Vital signs reviewed and stable, and Patient moving all extremities  Post vital signs: Reviewed and stable  Last Vitals:  Vitals Value Taken Time  BP 105/53 09/19/22 1404  Temp 36.7 C 09/19/22 1404  Pulse 50 09/19/22 1405  Resp 12 09/19/22 1405  SpO2 94 % 09/19/22 1405  Vitals shown include unfiled device data.  Last Pain:  Vitals:   09/19/22 1404  TempSrc: Tympanic  PainSc: Asleep      Patients Stated Pain Goal: 0 (09/18/22 2337)  Complications: No notable events documented.  *Report given to Cath Lab RN for continued care and follow-up.

## 2022-09-19 NOTE — Discharge Instructions (Addendum)
Dear [name],   Thank you so much for allowing [place] to be part of your care!  You were admitted to [facility] for uncontrolled chest pain and we found some new nodules in your lungs, we will continue workup for these outside the hospital.  DISCHARGE MEDICATION CHANGES We are sending you out on 5 days of oral oxycodone and naproxen, please follow up with your primary care doctor to get back on a regular dose of pain medicines. Please continue taking your oral antibiotics until your doses are finished, this is important in case there is any infectious component to your lung nodules.  POST-HOSPITAL & CARE INSTRUCTIONS Please call your Primary Care doctor and schedule an appointment within a week. Your PCP or our social worker can tell you when your lung doctor appointment is. Please let PCP/Specialists know of any changes that were made.  Please see medications section of this packet for any medication changes.   DOCTOR'S APPOINTMENT & FOLLOW UP CARE INSTRUCTIONS  No future appointments.  RETURN PRECAUTIONS: Please return if your pain is uncontrolled at home  Take care and be well!  Family Medicine Teaching Service Inpatient Team [facility]  [address] [place], [place] [address] [phone]

## 2022-09-19 NOTE — Anesthesia Postprocedure Evaluation (Signed)
Anesthesia Post Note  Patient: [name]  Procedure(s) Performed: TRANSESOPHAGEAL ECHOCARDIOGRAM     Patient location during evaluation: PACU Anesthesia Type: MAC Level of consciousness: awake Pain management: pain level controlled Vital Signs Assessment: post-procedure vital signs reviewed and stable Respiratory status: spontaneous breathing, nonlabored ventilation and respiratory function stable Cardiovascular status: stable and blood pressure returned to baseline Postop Assessment: no apparent nausea or vomiting Anesthetic complications: no   No notable events documented.  Last Vitals:  Vitals:   09/19/22 1424 09/19/22 1450  BP: (!) 147/83 (!) 158/83  Pulse: (!) 50 (!) 54  Resp: 12 16  Temp:  36.7 C  SpO2: 96% 98%    Last Pain:  Vitals:   09/19/22 1450  TempSrc: Oral  PainSc:                  [name]

## 2022-09-19 NOTE — H&P (View-Only) (Signed)
 NAME:  [name], MRN:  [id number], DOB:  09/16/1960, LOS: 4 ADMISSION DATE:  09/15/2022, CONSULTATION DATE:  09/15/2022 REFERRING MD: Wallace Cullens - FMTS CHIEF COMPLAINT:  Cavitary lung nodules   History of Present Illness:  62 year old man with history of lingular lung abscess, remote IVDU, presents with 2 days of pleuritic thoracic pain, primarily over right scapula. Worse with cough and deep breath. Non-tender. 7/10. Steadily worsening, so he came to ED. Cough occasionally productive of bloody sputum for 2 weeks. No dyspnea. No chest pain. Denies fevers, chills, night sweats. Weight mostly stable, no unintentional loss. Decent energy and appetite. No hematuria. No abdominal pain or problems. Has seasonal allergies, but no sinus problems or nosebleeds.  Had chest pain in 2007, workup included percutaneous lung biopsy complicated by MRSA lung abscess. He underwent resection of the left lingula for this. Uncertain why lung biopsy was indicated. Around this time he had hematuria as well.  PET from 2009 shows reduction in size of LLL pleural mass with small central area of metabolic activity, but no evidence of lung cancer recurrence elsewhere on scan. CT was recommended for 3 month follow-up at that time, uncertain if it was ever done.  Family history notable for lung cancer in 16s in brother and sister.  Social history notable for remote IVDU, heavy smoking 1-2 ppd for lifetime, remote history of incarceration.  Pertinent Medical History:  Remote IV drug use ?Hepatitis C in chart Tobacco abuse, 2PPD x 6-7 months, prior to that 1PPD  Significant Hospital Events: Including procedures, antibiotic start and stop dates in addition to other pertinent events   8/16 Admit to family medicine teaching service. CTA Chest negative for PE, +multiple spiculated pulmonary nodules with small cavitary nodule of superior RLL, c/f pulmonary mets vs. Septic emboli 8/19 Plan for TEE today/tomorrow with Cards, r/o  vegetation.  Interim History / Subjective:  He denies complaints, less coughing up blood.   Objective:  Blood pressure (!) 158/83, pulse (!) 54, temperature 98 F (36.7 C), temperature source Oral, resp. rate 16, height 6' (1.829 m), weight 103 kg, SpO2 98%.        Intake/Output Summary (Last 24 hours) at 09/19/2022 1557 Last data filed at 09/19/2022 1406 Gross per 24 hour  Intake 2339.88 ml  Output --  Net 2339.88 ml   Filed Weights   09/16/22 0600 09/18/22 0502 09/19/22 1050  Weight: 103 kg 102 kg 103 kg   Physical Examination: General: Middle-age man sitting up in bed no acute distress HEENT: /AT, eyes anicteric Neuro: Awake and alert, answering questions appropriately  CV: S1-S2, regular rate and rhythm PULM: Comfortable on room air, CTAB. GI: Soft, nontender, nondistended Extremities: No peripheral edema Skin: No diffuse rashes  CRP 1.1 Anti-double-stranded DNA negative ANA negative RF 14.8, minimally elevated ANCA's negative Anti-SSA and SSB negative Anti-SCL 70 negative Procalcitonin < 0.1  Resolved Hospital Problem List    Assessment & Plan:  Principal Problem:   Pulmonary nodules Active Problems:   Abnormal CT scan of lung   Septic embolism (HCC)   Pleuritic chest pain   HCV antibody positive   Lung mass  Bilateral spiculated pulmonary nodules; serology is not suggestive of autoimmune cause, so far TB workup has not been convincing either.  Concern for malignancy. Family history of lung cancer History of COPD - not exacerbated Polysubstance use disorder - no recent IVDU HCV Ab+ -TEE performed today, no evidence of endocarditis - Recommend outpatient bronchoscopy, navigation or robotic preferred due to peripheral  nodules to increase diagnostic yield.  Discussed with Dr. Tonia Brooms who could likely do his case next week.  Reviewed this plan with the patient including my concern that he could potentially have malignancy, especially concerning with his  smoking history and family history.  He agreed to undergo bronchoscopy as an outpatient.  Not on anticoagulant or antiplatelet medications.   Signature:  Steffanie Dunn, DO 09/19/22 6:09 PM [facility]  For contact information, see Amion. If no response to pager, please call PCCM consult pager. After hours, 7PM- 7AM, please call Elink.

## 2022-09-19 NOTE — Assessment & Plan Note (Signed)
Continuing to report pain, but controlled.  Dilaudid x3 yesterday, 30 ME.  No BM since 8/15. - Pain regimen: Transition from Dilaudid to ME oxycodone 5 mg Q6h PRN, acetaminophen 650 mg Q6h, Toradol 30 mg Q6h - Bowel regimen: Increased scheduled Senna and Miralax to BID

## 2022-09-19 NOTE — Plan of Care (Signed)

## 2022-09-19 NOTE — TOC Transition Note (Addendum)
Transition of Care [facility]) - CM/SW Discharge Note   Patient Details  Name: [name] MRN: [id number] Date of Birth: 1960/08/02  [facility]) CM/SW Contact:  Leone Haven, RN Phone Number: 09/19/2022, 3:40 PM   Clinical Narrative:    For dc today, has transportation. Has no needs.  TOC to fill meds.   Final next level of care: Home/Self Care     Patient Goals and CMS Choice      Discharge Placement                         Discharge Plan and Services Additional resources added to the After Visit Summary for                                       Social Determinants of Health (SDOH) Interventions SDOH Screenings   Food Insecurity: Patient Declined (09/15/2022)  Housing: Patient Declined (09/15/2022)  Transportation Needs: Patient Declined (09/15/2022)  Utilities: Patient Declined (09/15/2022)  Tobacco Use: High Risk (09/15/2022)     Readmission Risk Interventions     No data to display

## 2022-09-19 NOTE — Assessment & Plan Note (Signed)
1.5 PPD for ~45 years. - Nicotine replacement patch 21 mg daily

## 2022-09-20 ENCOUNTER — Encounter (HOSPITAL_COMMUNITY): Payer: Self-pay | Admitting: Cardiovascular Disease

## 2022-09-20 LAB — CULTURE, BLOOD (ROUTINE X 2)
Culture: NO GROWTH
Culture: NO GROWTH
Special Requests: ADEQUATE

## 2022-09-20 NOTE — Telephone Encounter (Signed)
PCCM:  Orders placed for bronchoscopy on 09/28/2022.  If patient is unable to do 829 then we would need to schedule for September 3.  Josephine Igo, DO  Pulmonary Critical Care 09/20/2022 4:14 PM

## 2022-09-29 ENCOUNTER — Encounter (HOSPITAL_COMMUNITY): Payer: Self-pay | Admitting: Pulmonary Disease

## 2022-09-29 NOTE — Progress Notes (Signed)
PCP - [facility] Cardiologist - Denies  EKG - 09/18/2022 Chest x-ray - 09/18/2022 ECHO - 09/19/2022 Cardiac Cath - Denies  Sleep Study-Yes CPAP - No  DM-Denies  Blood Thinner Instructions: Denies Aspirin Instructions: Denies  ERAS Protcol - NPO COVID TEST- N/A  Anesthesia review: No  -------------  SDW INSTRUCTIONS:  Your procedure is scheduled on Tuesday September 3rd. Please report to [facility] Main Entrance "A" at 0945 A.M., and check in at the Admitting office. Call this number if you have problems the morning of surgery: [phone]   Remember: Do not eat or drink after midnight the night before your surgery    Medications to take morning of surgery with a sip of water include: amoxicillin-clavulanate  doxycycline (VIBRA-TABS)  omeprazole (PRILOSEC)  pregabalin (LYRICA)    IF NEEDED albuterol (VENTOLIN HFA) Please bring on DOS TRELEGY ELLIPTA   As of today, STOP taking any Aspirin (unless otherwise instructed by your surgeon), Aleve, Naproxen, Ibuprofen, Motrin, Advil, Goody's, BC's, all herbal medications, fish oil, and all vitamins.    The Morning of Surgery Do not wear jewelry, make-up or nail polish. Do not wear lotions, powders, or perfumes/colognes, or deodorant Do not bring valuables to the hospital. [facility] is not responsible for any belongings or valuables.  If you are a smoker, DO NOT Smoke 24 hours prior to surgery  If you wear a CPAP at night please bring your mask the morning of surgery   Remember that you must have someone to transport you home after your surgery, and remain with you for 24 hours if you are discharged the same day.  Please bring cases for contacts, glasses, hearing aids, dentures or bridgework because it cannot be worn into surgery.   Patients discharged the day of surgery will not be allowed to drive home.   Please shower the NIGHT BEFORE/MORNING OF SURGERY (use  antibacterial soap like DIAL soap if possible). Wear comfortable clothes the morning of surgery. Oral Hygiene is also important to reduce your risk of infection.  Remember - BRUSH YOUR TEETH THE MORNING OF SURGERY WITH YOUR REGULAR TOOTHPASTE  Patient denies shortness of breath, fever, cough and chest pain.

## 2022-10-02 NOTE — Anesthesia Preprocedure Evaluation (Signed)
Anesthesia Evaluation  Patient identified by MRN, date of birth, ID band Patient awake    Reviewed: Allergy & Precautions, NPO status , Patient's Chart, lab work & pertinent test results  History of Anesthesia Complications Negative for: history of anesthetic complications  Airway Mallampati: II  TM Distance: >3 FB Neck ROM: Full    Dental  (+) Dental Advisory Given   Pulmonary neg shortness of breath, sleep apnea , COPD, neg recent URI, Current Smoker and Patient abstained from smoking. Lung nodules   Pulmonary exam normal breath sounds clear to auscultation       Cardiovascular hypertension, (-) angina (-) Past MI and (-) Cardiac Stents + Valvular Problems/Murmurs (mild) MR  Rhythm:Regular Rate:Normal  AAA, HLD  TEE 09/19/2022: IMPRESSIONS    1. Left ventricular ejection fraction, by estimation, is 55 to 60%. The  left ventricle has normal function. The left ventricle has no regional  wall motion abnormalities.   2. Right ventricular systolic function is normal. The right ventricular  size is normal.   3. No left atrial/left atrial appendage thrombus was detected.   4. The mitral valve is normal in structure. Mild mitral valve  regurgitation. No evidence of mitral stenosis.   5. The aortic valve is tricuspid. Aortic valve regurgitation is trivial.  No aortic stenosis is present.   6. The inferior vena cava is normal in size with greater than 50%  respiratory variability, suggesting right atrial pressure of 3 mmHg.   7. Agitated saline contrast bubble study was negative, with no evidence  of any interatrial shunt.     Neuro/Psych  PSYCHIATRIC DISORDERS Anxiety Depression    negative neurological ROS     GI/Hepatic ,GERD  Medicated,,(+)     substance abuse  IV drug use, Hepatitis -, C  Endo/Other  diabetes, Type 2 Hyperthyroidism (subclinical)   Renal/GU negative Renal ROS     Musculoskeletal  (+) Arthritis ,     Abdominal  (+) + obese  Peds  Hematology negative hematology ROS (+)   Anesthesia Other Findings   Reproductive/Obstetrics                             Anesthesia Physical Anesthesia Plan  ASA: 3  Anesthesia Plan: General   Post-op Pain Management: Tylenol PO (pre-op)*   Induction: Intravenous  PONV Risk Score and Plan: 1 and Ondansetron, Dexamethasone, Propofol infusion, TIVA and Treatment may vary due to age or medical condition  Airway Management Planned: Oral ETT  Additional Equipment:   Intra-op Plan:   Post-operative Plan: Extubation in OR  Informed Consent: I have reviewed the patients History and Physical, chart, labs and discussed the procedure including the risks, benefits and alternatives for the proposed anesthesia with the patient or authorized representative who has indicated his/her understanding and acceptance.     Dental advisory given  Plan Discussed with: CRNA and Anesthesiologist  Anesthesia Plan Comments: (Risks of general anesthesia discussed including, but not limited to, sore throat, hoarse voice, chipped/damaged teeth, injury to vocal cords, nausea and vomiting, allergic reactions, lung infection, heart attack, stroke, and death. All questions answered. )       Anesthesia Quick Evaluation

## 2022-10-03 ENCOUNTER — Ambulatory Visit (HOSPITAL_COMMUNITY)
Admission: RE | Admit: 2022-10-03 | Discharge: 2022-10-03 | Disposition: A | Discharge status: Home / Self Care | Payer: 59 | Source: Ambulatory Visit | Attending: Pulmonary Disease | Admitting: Pulmonary Disease

## 2022-10-03 ENCOUNTER — Encounter (HOSPITAL_COMMUNITY): Payer: Self-pay | Admitting: Pulmonary Disease

## 2022-10-03 ENCOUNTER — Ambulatory Visit (HOSPITAL_COMMUNITY): Payer: 59

## 2022-10-03 ENCOUNTER — Ambulatory Visit (HOSPITAL_BASED_OUTPATIENT_CLINIC_OR_DEPARTMENT_OTHER): Payer: 59 | Admitting: Anesthesiology

## 2022-10-03 ENCOUNTER — Ambulatory Visit (HOSPITAL_COMMUNITY): Payer: 59 | Admitting: Anesthesiology

## 2022-10-03 ENCOUNTER — Other Ambulatory Visit: Payer: Self-pay

## 2022-10-03 ENCOUNTER — Encounter (HOSPITAL_COMMUNITY)
Admission: RE | Disposition: A | Discharge status: Home / Self Care | Payer: Self-pay | Source: Ambulatory Visit | Attending: Pulmonary Disease

## 2022-10-03 DIAGNOSIS — I10 Essential (primary) hypertension: Secondary | ICD-10-CM | POA: Diagnosis not present

## 2022-10-03 DIAGNOSIS — F17210 Nicotine dependence, cigarettes, uncomplicated: Secondary | ICD-10-CM

## 2022-10-03 DIAGNOSIS — J449 Chronic obstructive pulmonary disease, unspecified: Secondary | ICD-10-CM | POA: Diagnosis not present

## 2022-10-03 DIAGNOSIS — E785 Hyperlipidemia, unspecified: Secondary | ICD-10-CM | POA: Insufficient documentation

## 2022-10-03 DIAGNOSIS — B1920 Unspecified viral hepatitis C without hepatic coma: Secondary | ICD-10-CM | POA: Insufficient documentation

## 2022-10-03 DIAGNOSIS — Z79899 Other long term (current) drug therapy: Secondary | ICD-10-CM | POA: Diagnosis not present

## 2022-10-03 DIAGNOSIS — R918 Other nonspecific abnormal finding of lung field: Secondary | ICD-10-CM | POA: Diagnosis present

## 2022-10-03 DIAGNOSIS — K219 Gastro-esophageal reflux disease without esophagitis: Secondary | ICD-10-CM | POA: Diagnosis not present

## 2022-10-03 DIAGNOSIS — E0580 Other thyrotoxicosis without thyrotoxic crisis or storm: Secondary | ICD-10-CM | POA: Insufficient documentation

## 2022-10-03 DIAGNOSIS — E119 Type 2 diabetes mellitus without complications: Secondary | ICD-10-CM | POA: Diagnosis not present

## 2022-10-03 HISTORY — DX: Unspecified osteoarthritis, unspecified site: M19.90

## 2022-10-03 HISTORY — PX: BRONCHIAL WASHINGS: SHX5105

## 2022-10-03 HISTORY — DX: Chronic obstructive pulmonary disease, unspecified: J44.9

## 2022-10-03 HISTORY — DX: Sleep apnea, unspecified: G47.30

## 2022-10-03 HISTORY — PX: BRONCHIAL BIOPSY: SHX5109

## 2022-10-03 HISTORY — PX: BRONCHIAL NEEDLE ASPIRATION BIOPSY: SHX5106

## 2022-10-03 HISTORY — DX: Essential (primary) hypertension: I10

## 2022-10-03 HISTORY — DX: Inflammatory liver disease, unspecified: K75.9

## 2022-10-03 LAB — SURGICAL PCR SCREEN
MRSA, PCR: NEGATIVE
Staphylococcus aureus: NEGATIVE

## 2022-10-03 SURGERY — BRONCHOSCOPY, WITH BIOPSY USING ELECTROMAGNETIC NAVIGATION
Anesthesia: General | Laterality: Bilateral

## 2022-10-03 MED ORDER — ACETAMINOPHEN 500 MG PO TABS: 1000.0000 mg | ORAL_TABLET | Freq: Once | ORAL | Status: AC

## 2022-10-03 MED ORDER — OXYCODONE HCL 5 MG PO TABS: 5.0000 mg | ORAL_TABLET | Freq: Once | ORAL | Status: DC | PRN

## 2022-10-03 MED ORDER — FENTANYL CITRATE (PF) 100 MCG/2ML IJ SOLN
INTRAMUSCULAR | Status: AC
  Filled 2022-10-03: qty 2

## 2022-10-03 MED ORDER — CHLORHEXIDINE GLUCONATE 0.12 % MT SOLN
OROMUCOSAL | Status: AC
  Administered 2022-10-03: 15 mL
  Filled 2022-10-03: qty 15

## 2022-10-03 MED ORDER — FENTANYL CITRATE (PF) 250 MCG/5ML IJ SOLN
INTRAMUSCULAR | Status: DC | PRN
  Administered 2022-10-03 (×2): 50 ug via INTRAVENOUS

## 2022-10-03 MED ORDER — PROPOFOL 500 MG/50ML IV EMUL
INTRAVENOUS | Status: DC | PRN
Start: 2022-10-03 — End: 2022-10-03
  Administered 2022-10-03: 100 ug/kg/min via INTRAVENOUS

## 2022-10-03 MED ORDER — ONDANSETRON HCL 4 MG/2ML IJ SOLN
INTRAMUSCULAR | Status: DC | PRN
  Administered 2022-10-03: 4 mg via INTRAVENOUS

## 2022-10-03 MED ORDER — GLYCOPYRROLATE 0.2 MG/ML IJ SOLN
INTRAMUSCULAR | Status: DC | PRN
Start: 2022-10-03 — End: 2022-10-03
  Administered 2022-10-03: .2 mg via INTRAVENOUS

## 2022-10-03 MED ORDER — SUGAMMADEX SODIUM 200 MG/2ML IV SOLN
INTRAVENOUS | Status: DC | PRN
  Administered 2022-10-03: 200 mg via INTRAVENOUS

## 2022-10-03 MED ORDER — PROPOFOL 10 MG/ML IV BOLUS
INTRAVENOUS | Status: DC | PRN
  Administered 2022-10-03: 200 mg via INTRAVENOUS

## 2022-10-03 MED ORDER — LACTATED RINGERS IV SOLN: INTRAVENOUS | Status: DC

## 2022-10-03 MED ORDER — DEXAMETHASONE SODIUM PHOSPHATE 10 MG/ML IJ SOLN
INTRAMUSCULAR | Status: DC | PRN
  Administered 2022-10-03: 5 mg via INTRAVENOUS

## 2022-10-03 MED ORDER — LIDOCAINE 2% (20 MG/ML) 5 ML SYRINGE
INTRAMUSCULAR | Status: DC | PRN
  Administered 2022-10-03: 100 mg via INTRAVENOUS

## 2022-10-03 MED ORDER — OXYCODONE HCL 5 MG/5ML PO SOLN: 5.0000 mg | Freq: Once | ORAL | Status: DC | PRN

## 2022-10-03 MED ORDER — ACETAMINOPHEN 500 MG PO TABS
ORAL_TABLET | ORAL | Status: AC
  Administered 2022-10-03: 1000 mg via ORAL
  Filled 2022-10-03: qty 2

## 2022-10-03 MED ORDER — ONDANSETRON HCL 4 MG/2ML IJ SOLN
INTRAMUSCULAR | Status: AC
  Filled 2022-10-03: qty 2

## 2022-10-03 MED ORDER — AMISULPRIDE (ANTIEMETIC) 5 MG/2ML IV SOLN: 10.0000 mg | Freq: Once | INTRAVENOUS | Status: DC | PRN

## 2022-10-03 MED ORDER — ROCURONIUM BROMIDE 10 MG/ML (PF) SYRINGE
PREFILLED_SYRINGE | INTRAVENOUS | Status: DC | PRN
  Administered 2022-10-03: 60 mg via INTRAVENOUS
  Administered 2022-10-03 (×2): 10 mg via INTRAVENOUS

## 2022-10-03 MED ORDER — FENTANYL CITRATE (PF) 100 MCG/2ML IJ SOLN: 25.0000 ug | INTRAMUSCULAR | Status: DC | PRN

## 2022-10-03 NOTE — Op Note (Signed)
Video Bronchoscopy with Robotic Assisted Bronchoscopic Navigation   Date of Operation: 10/03/2022   Pre-op Diagnosis: lung nodules   Post-op Diagnosis: lung nodules   Surgeon: Josephine Igo, DO   Assistants: None  Anesthesia: General endotracheal anesthesia  Operation: Flexible video fiberoptic bronchoscopy with robotic assistance and biopsies.  Estimated Blood Loss: Minimal  Complications: None  Indications and History: [name] is a 62 y.o. male with history of bilateral lung nodules . The risks, benefits, complications, treatment options and expected outcomes were discussed with the patient.  The possibilities of pneumothorax, pneumonia, reaction to medication, pulmonary aspiration, perforation of a viscus, bleeding, failure to diagnose a condition and creating a complication requiring transfusion or operation were discussed with the patient who freely signed the consent.    Description of Procedure: The patient was seen in the Preoperative Area, was examined and was deemed appropriate to proceed.  The patient was taken to [facility] endoscopy room 3, identified as [name] and the procedure verified as Flexible Video Fiberoptic Bronchoscopy.  A Time Out was held and the above information confirmed.   Prior to the date of the procedure a high-resolution CT scan of the chest was performed. Utilizing ION software program a virtual tracheobronchial tree was generated to allow the creation of distinct navigation pathways to the patient's parenchymal abnormalities. After being taken to the operating room general anesthesia was initiated and the patient  was orally intubated. The video fiberoptic bronchoscope was introduced via the endotracheal tube and a general inspection was performed which showed normal right and left lung anatomy, aspiration of the bilateral mainstems was completed to remove any remaining secretions. Robotic catheter inserted into patient's endotracheal tube.   Target  #1 RUL: The distinct navigation pathways prepared prior to this procedure were then utilized to navigate to patient's lesion identified on CT scan. The robotic catheter was secured into place and the vision probe was withdrawn.  Lesion location was approximated using fluoroscopy and 3d CBCT for CT guided needle placement for peripheral targeting. Under fluoroscopic guidance transbronchial needle brushings, transbronchial needle biopsies, and transbronchial forceps biopsies were performed to be sent for cytology and pathology. A bronchioalveolar lavage was performed in the RUL and sent for cytology.  Target #2 LLL: The distinct navigation pathways prepared prior to this procedure were then utilized to navigate to patient's lesion identified on CT scan. The robotic catheter was secured into place and the vision probe was withdrawn.  Lesion location was approximated using fluoroscopy and 3D CBCT for CT guided needle placement for peripheral targeting. Under fluoroscopic guidance transbronchial needle brushings, transbronchial needle biopsies, and transbronchial forceps biopsies were performed to be sent for cytology and pathology.   At the end of the procedure a general airway inspection was performed and there was no evidence of active bleeding. The bronchoscope was removed.  The patient tolerated the procedure well. There was no significant blood loss and there were no obvious complications. A post-procedural chest x-ray is pending.  Samples Target #1: 2. Transbronchial Wang needle biopsies from RUL 3. Transbronchial forceps biopsies from RUL 4. Bronchoalveolar lavage from RUL  Samples Target #2: 2. Transbronchial Wang needle biopsies from LLL 3. Transbronchial forceps biopsies from LLL  Plans:  The patient will be discharged from the PACU to home when recovered from anesthesia and after chest x-ray is reviewed. We will review the cytology, pathology and microbiology results with the patient when  they become available. Outpatient followup will be with Rachel Bo Samaiyah Howes, DO.  Josephine Igo, DO [facility] 10/03/2022 5:30 PM

## 2022-10-03 NOTE — Transfer of Care (Signed)
Immediate Anesthesia Transfer of Care Note  Patient: [name]  Procedure(s) Performed: ROBOTIC ASSISTED NAVIGATIONAL BRONCHOSCOPY (Bilateral) BRONCHIAL BIOPSIES BRONCHIAL NEEDLE ASPIRATION BIOPSIES  Patient Location: PACU  Anesthesia Type:General  Level of Consciousness: awake, alert , and oriented  Airway & Oxygen Therapy: Patient Spontanous Breathing and Patient connected to face mask  Post-op Assessment: Report given to RN and Post -op Vital signs reviewed and stable  Post vital signs: Reviewed and stable  Last Vitals:  Vitals Value Taken Time  BP 152/72 10/03/22 1700  Temp 36.4 C 10/03/22 1646  Pulse 45 10/03/22 1700  Resp 12 10/03/22 1700  SpO2 93 % 10/03/22 1700  Vitals shown include unfiled device data.  Last Pain:  Vitals:   10/03/22 1646  TempSrc:   PainSc: 0-No pain         Complications: No notable events documented.

## 2022-10-03 NOTE — Anesthesia Procedure Notes (Addendum)
Procedure Name: Intubation Date/Time: 10/03/2022 3:13 PM  Performed by: Camillia Herter, CRNAPre-anesthesia Checklist: Patient identified, Emergency Drugs available, Suction available and Patient being monitored Patient Re-evaluated:Patient Re-evaluated prior to induction Oxygen Delivery Method: Circle System Utilized Preoxygenation: Pre-oxygenation with 100% oxygen Induction Type: IV induction Ventilation: Mask ventilation without difficulty Laryngoscope Size: Mac and 3 Grade View: Grade II Tube type: Oral Tube size: 9.0 mm Number of attempts: 1 Airway Equipment and Method: Stylet and Oral airway Placement Confirmation: ETT inserted through vocal cords under direct vision, positive ETCO2 and breath sounds checked- equal and bilateral Secured at: 23 cm Tube secured with: Tape Dental Injury: Teeth and Oropharynx as per pre-operative assessment

## 2022-10-03 NOTE — Interval H&P Note (Signed)
History and Physical Interval Note:  10/03/2022 2:30 PM  [name]  has presented today for surgery, with the diagnosis of lung nodules.  The various methods of treatment have been discussed with the patient and family. After consideration of risks, benefits and other options for treatment, the patient has consented to  Procedure(s): ROBOTIC ASSISTED NAVIGATIONAL BRONCHOSCOPY (Bilateral) as a surgical intervention.  The patient's history has been reviewed, patient examined, no change in status, stable for surgery.  I have reviewed the patient's chart and labs.  Questions were answered to the patient's satisfaction.     Rachel Bo Adrian Dinovo

## 2022-10-03 NOTE — Anesthesia Postprocedure Evaluation (Signed)
Anesthesia Post Note  Patient: [name]  Procedure(s) Performed: ROBOTIC ASSISTED NAVIGATIONAL BRONCHOSCOPY (Bilateral) BRONCHIAL BIOPSIES BRONCHIAL NEEDLE ASPIRATION BIOPSIES BRONCHIAL WASHINGS     Patient location during evaluation: PACU Anesthesia Type: General Level of consciousness: awake Pain management: pain level controlled Vital Signs Assessment: post-procedure vital signs reviewed and stable Respiratory status: spontaneous breathing, nonlabored ventilation and respiratory function stable Cardiovascular status: blood pressure returned to baseline and stable Postop Assessment: no apparent nausea or vomiting Anesthetic complications: no   No notable events documented.  Last Vitals:  Vitals:   10/03/22 1730 10/03/22 1745  BP: (!) 145/96   Pulse: (!) 59 (!) 45  Resp: 11 11  Temp:  36.4 C  SpO2: 94% 94%    Last Pain:  Vitals:   10/03/22 1745  TempSrc:   PainSc: 0-No pain                 [name]

## 2022-10-03 NOTE — Discharge Instructions (Signed)
Flexible Bronchoscopy, Care After This sheet gives you information about how to care for yourself after your test. Your doctor may also give you more specific instructions. If you have problems or questions, contact your doctor. Follow these instructions at home: Eating and drinking Do not eat or drink anything (not even water) for 2 hours after your test, or until your numbing medicine (local anesthetic) wears off. When your numbness is gone and your cough and gag reflexes have come back, you may: Eat only soft foods. Slowly drink liquids. The day after the test, go back to your normal diet. Driving Do not drive for 24 hours if you were given a medicine to help you relax (sedative). Do not drive or use heavy machinery while taking prescription pain medicine. General instructions  Take over-the-counter and prescription medicines only as told by your doctor. Return to your normal activities as told. Ask what activities are safe for you. Do not use any products that have nicotine or tobacco in them. This includes cigarettes and e-cigarettes. If you need help quitting, ask your doctor. Keep all follow-up visits as told by your doctor. This is important. It is very important if you had a tissue sample (biopsy) taken. Get help right away if: You have shortness of breath that gets worse. You get light-headed. You feel like you are going to pass out (faint). You have chest pain. You cough up: More than a little blood. More blood than before. Summary Do not eat or drink anything (not even water) for 2 hours after your test, or until your numbing medicine wears off. Do not use cigarettes. Do not use e-cigarettes. Get help right away if you have chest pain.  This information is not intended to replace advice given to you by your health care provider. Make sure you discuss any questions you have with your health care provider. Document Released: 11/13/2008 Document Revised: 12/29/2016 Document  Reviewed: 02/04/2016 Elsevier Patient Education  2020 [employer]  

## 2022-10-05 LAB — ACID FAST SMEAR (AFB, MYCOBACTERIA)
Acid Fast Smear: NEGATIVE
Acid Fast Smear: NEGATIVE

## 2022-10-05 LAB — CYTOLOGY - NON PAP

## 2022-10-06 LAB — CULTURE, BAL-QUANTITATIVE W GRAM STAIN: Culture: NO GROWTH

## 2022-10-06 LAB — ACID FAST SMEAR (AFB, MYCOBACTERIA): Acid Fast Smear: NEGATIVE

## 2022-10-07 LAB — FUNGUS CULTURE WITH STAIN

## 2022-10-07 LAB — FUNGUS CULTURE RESULT

## 2022-10-08 ENCOUNTER — Encounter (HOSPITAL_COMMUNITY): Payer: Self-pay | Admitting: Pulmonary Disease

## 2022-10-08 LAB — ANAEROBIC CULTURE W GRAM STAIN

## 2022-10-08 LAB — AEROBIC/ANAEROBIC CULTURE W GRAM STAIN (SURGICAL/DEEP WOUND)
Gram Stain: NONE SEEN
Gram Stain: NONE SEEN

## 2022-10-09 ENCOUNTER — Ambulatory Visit (INDEPENDENT_AMBULATORY_CARE_PROVIDER_SITE_OTHER): Payer: 59 | Admitting: Pulmonary Disease

## 2022-10-09 ENCOUNTER — Encounter: Payer: Self-pay | Admitting: Pulmonary Disease

## 2022-10-09 VITALS — BP 140/90 | HR 69 | Ht 72.0 in | Wt 249.6 lb

## 2022-10-09 DIAGNOSIS — F17200 Nicotine dependence, unspecified, uncomplicated: Secondary | ICD-10-CM

## 2022-10-09 DIAGNOSIS — R918 Other nonspecific abnormal finding of lung field: Secondary | ICD-10-CM | POA: Diagnosis not present

## 2022-10-09 NOTE — Patient Instructions (Signed)
Thank you for visiting Dr. Tonia Brooms at [facility]. Today we recommend the following:  Orders Placed This Encounter  Procedures   CT Chest Wo Contrast   Pulmonary Function Test   Return in about 3 months (around 01/08/2023) for with APP, after PFTs, after CT Chest.    Please do your part to reduce the spread of COVID-19.

## 2022-10-09 NOTE — Progress Notes (Signed)
Synopsis: Referred in September 2024 for pulmonary nodules by [facility]*  Subjective:   PATIENT ID: [name] GENDER: male DOB: Apr 02, 1960, MRN: [id number]  Chief Complaint  Patient presents with   Consult    Consult on Lung nodule.    This is a 62 year old gentleman initially seen in the hospital with bilateral pulmonary nodules by Dr. Chestine Spore.  The patient was referred for navigational bronchoscopy and tissue sampling.  Had lower lobe nodules inflammatory changes concerning for possible septic emboli versus malignancy with a longstanding history of smoking.  Patient has a history of COPD hypertension and sleep apnea.  Brother and sister with lung cancer.  Patient was taken for bronchoscopy on 10/03/2022 and has follow-up today.Bronchoscopically with completed navigation to a right upper lobe and left lower lobe pulmonary nodule we took specimens from both locations as well as tissue cultures.  Patient's AFB cultures were negative to date.  Lavage was negative.  Transbronchial tissue cultures have rare Staph epidermidis cytology showed benign bronchial cells and macrophages.    Past Medical History:  Diagnosis Date   Arthritis    COPD (chronic obstructive pulmonary disease) (HCC)    Hepatitis    Hypertension    Sleep apnea      Family History  Problem Relation Age of Onset   Lung cancer Sister    Lung cancer Brother      Past Surgical History:  Procedure Laterality Date   BRONCHIAL BIOPSY  10/03/2022   Procedure: BRONCHIAL BIOPSIES;  Surgeon: Josephine Igo, DO;  Location: [facility];  Service: Pulmonary;;   BRONCHIAL NEEDLE ASPIRATION BIOPSY  10/03/2022   Procedure: BRONCHIAL NEEDLE ASPIRATION BIOPSIES;  Surgeon: Josephine Igo, DO;  Location: [facility];  Service: Pulmonary;;   BRONCHIAL WASHINGS  10/03/2022   Procedure: BRONCHIAL WASHINGS;  Surgeon: Josephine Igo, DO;  Location: [facility];  Service: Pulmonary;;   CERVICAL SPINE SURGERY     Partial  left lung resection Left    TEE WITHOUT CARDIOVERSION N/A 09/19/2022   Procedure: TRANSESOPHAGEAL ECHOCARDIOGRAM;  Surgeon: Chilton Si, MD;  Location: [facility] INVASIVE CV LAB;  Service: Cardiovascular;  Laterality: N/A;    Social History   Socioeconomic History   Marital status: Married    Spouse name: Not on file   Number of children: Not on file   Years of education: Not on file   Highest education level: Not on file  Occupational History   Not on file  Tobacco Use   Smoking status: Every Day    Current packs/day: 1.50    Average packs/day: 1.5 packs/day for 45.7 years (68.5 ttl pk-yrs)    Types: Cigarettes    Start date: 1979   Smokeless tobacco: Never   Tobacco comments:    Smokes a pack of cigarettes a day. 10/09/22 [place]  Substance and Sexual Activity   Alcohol use: Yes    Comment: once on a while   Drug use: Not Currently   Sexual activity: Not on file  Other Topics Concern   Not on file  Social History Narrative   Not on file   Social Determinants of Health   Financial Resource Strain: Not on file  Food Insecurity: Patient Declined (09/15/2022)   Hunger Vital Sign    Worried About Running Out of Food in the Last Year: Patient declined    Ran Out of Food in the Last Year: Patient declined  Transportation Needs: Patient Declined (09/15/2022)   PRAPARE - Transportation    Lack  of Transportation (Medical): Patient declined    Lack of Transportation (Non-Medical): Patient declined  Physical Activity: Not on file  Stress: Not on file  Social Connections: Not on file  Intimate Partner Violence: Patient Declined (09/15/2022)   Humiliation, Afraid, Rape, and Kick questionnaire    Fear of Current or Ex-Partner: Patient declined    Emotionally Abused: Patient declined    Physically Abused: Patient declined    Sexually Abused: Patient declined     Allergies  Allergen Reactions   Diovan Hct [Valsartan-Hydrochlorothiazide] Other (See Comments)    Hypotension  Dizziness     Norvasc [Amlodipine] Other (See Comments)    Hypotension Dizziness      Outpatient Medications Prior to Visit  Medication Sig Dispense Refill   albuterol (VENTOLIN HFA) 108 (90 Base) MCG/ACT inhaler Inhale into the lungs.     amitriptyline (ELAVIL) 75 MG tablet Take 75 mg by mouth at bedtime.     amoxicillin-clavulanate (AUGMENTIN) 875-125 MG tablet Take 1 tablet by mouth 2 (two) times daily. 11 tablet 0   citalopram (CELEXA) 40 MG tablet Take 40 mg by mouth at bedtime.     doxycycline (VIBRA-TABS) 100 MG tablet Take 1 tablet (100 mg total) by mouth 2 (two) times daily. 11 tablet 0   naproxen (NAPROSYN) 500 MG tablet Take 1 tablet (500 mg total) by mouth 3 (three) times daily with meals. 60 tablet 1   omeprazole (PRILOSEC) 40 MG capsule TAKE 1 CAPSULE BY MOUTH TWICE DAILY. TAKE FIRST DOSE 30 MINS PRIOR TO MORNINGMEAL.     polyethylene glycol powder (GLYCOLAX/MIRALAX) 17 GM/SCOOP powder Take 17 g by mouth 2 (two) times daily. 238 g 0   pregabalin (LYRICA) 150 MG capsule Take 150 mg by mouth 3 (three) times daily.     rOPINIRole (REQUIP) 2 MG tablet Take 2 mg by mouth at bedtime.     senna (SENOKOT) 8.6 MG TABS tablet Take 1 tablet (8.6 mg total) by mouth 2 (two) times daily. 120 tablet 0   traZODone (DESYREL) 100 MG tablet Take 100-200 mg by mouth at bedtime as needed for sleep.     TRELEGY ELLIPTA 100-62.5-25 MCG/ACT AEPB Inhale 1 puff into the lungs daily as needed (shortness of breath).     No facility-administered medications prior to visit.    Review of Systems  Constitutional:  Negative for chills, fever, malaise/fatigue and weight loss.  HENT:  Negative for hearing loss, sore throat and tinnitus.   Eyes:  Negative for blurred vision and double vision.  Respiratory:  Negative for cough, hemoptysis, sputum production, shortness of breath, wheezing and stridor.   Cardiovascular:  Negative for chest pain, palpitations, orthopnea, leg swelling and PND.  Gastrointestinal:  Negative  for abdominal pain, constipation, diarrhea, heartburn, nausea and vomiting.  Genitourinary:  Negative for dysuria, hematuria and urgency.  Musculoskeletal:  Negative for joint pain and myalgias.  Skin:  Negative for itching and rash.  Neurological:  Negative for dizziness, tingling, weakness and headaches.  Endo/Heme/Allergies:  Negative for environmental allergies. Does not bruise/bleed easily.  Psychiatric/Behavioral:  Negative for depression. The patient is not nervous/anxious and does not have insomnia.   All other systems reviewed and are negative.    Objective:  Physical Exam Vitals reviewed.  Constitutional:      General: He is not in acute distress.    Appearance: He is well-developed. He is obese.  HENT:     Head: Normocephalic and atraumatic.  Eyes:     General: No scleral icterus.  Conjunctiva/sclera: Conjunctivae normal.     Pupils: Pupils are equal, round, and reactive to light.  Neck:     Vascular: No JVD.     Trachea: No tracheal deviation.  Cardiovascular:     Rate and Rhythm: Normal rate and regular rhythm.     Heart sounds: Normal heart sounds. No murmur heard. Pulmonary:     Effort: Pulmonary effort is normal. No tachypnea, accessory muscle usage or respiratory distress.     Breath sounds: No stridor. No wheezing, rhonchi or rales.  Abdominal:     General: There is no distension.     Palpations: Abdomen is soft.     Tenderness: There is no abdominal tenderness.  Musculoskeletal:        General: No tenderness.     Cervical back: Neck supple.  Lymphadenopathy:     Cervical: No cervical adenopathy.  Skin:    General: Skin is warm and dry.     Capillary Refill: Capillary refill takes less than 2 seconds.     Findings: No rash.  Neurological:     Mental Status: He is alert and oriented to person, place, and time.  Psychiatric:        Behavior: Behavior normal.      Vitals:   10/09/22 1336  BP: (!) 140/90  Pulse: 69  SpO2: 98%  Weight: 249 lb  9.6 oz (113.2 kg)  Height: 6' (1.829 m)   98% on RA BMI Readings from Last 3 Encounters:  10/09/22 33.85 kg/m  10/03/22 32.55 kg/m  09/19/22 30.79 kg/m   Wt Readings from Last 3 Encounters:  10/09/22 249 lb 9.6 oz (113.2 kg)  10/03/22 240 lb (108.9 kg)  09/19/22 227 lb (103 kg)     CBC    Component Value Date/Time   WBC 7.4 09/17/2022 0120   RBC 3.82 (L) 09/17/2022 0120   HGB 11.2 (L) 09/17/2022 0120   HCT 36.5 (L) 09/17/2022 0120   PLT 215 09/17/2022 0120   MCV 95.5 09/17/2022 0120   MCH 29.3 09/17/2022 0120   MCHC 30.7 09/17/2022 0120   RDW 14.2 09/17/2022 0120   LYMPHSABS 1.6 09/17/2022 0120   MONOABS 0.6 09/17/2022 0120   EOSABS 0.2 09/17/2022 0120   BASOSABS 0.0 09/17/2022 0120     Chest Imaging:  8/16 CT Chest:  Bliateral pulmonary nodules The patient's images have been independently reviewed by me.    Pulmonary Functions Testing Results:     No data to display          FeNO:   Pathology: macros, inflammation, benign bronchial cells   Echocardiogram:   Heart Catheterization:     Assessment & Plan:     ICD-10-CM   1. Multiple pulmonary nodules  R91.8 CT Chest Wo Contrast    Pulmonary Function Test    2. Current smoker  F17.200       Discussion:  This is a 62 year old gentleman seen today with multiple pulmonary nodules he is current smoker.  His brother and sister both died from lung cancer he is very worried about his lower lobe nodules.  Biopsies were negative he did have staph epi in 1 culture.  I suspect this is contaminant.  Plan: Encouraged smoking cessation.  We talked about this today in the office. Follow-up to see [place] in 3 months with pulmonary function test and repeat noncontrasted CT. As explained that if the nodules misbehave get larger or change in morphology that we can always consider repeat biopsy if he felt  that were concerning for malignancy or consideration for nuclear medicine pet imaging before moving  forward. Patient is agreeable to this plan. RTC in 3 months after test complete.   Current Outpatient Medications:    albuterol (VENTOLIN HFA) 108 (90 Base) MCG/ACT inhaler, Inhale into the lungs., Disp: , Rfl:    amitriptyline (ELAVIL) 75 MG tablet, Take 75 mg by mouth at bedtime., Disp: , Rfl:    amoxicillin-clavulanate (AUGMENTIN) 875-125 MG tablet, Take 1 tablet by mouth 2 (two) times daily., Disp: 11 tablet, Rfl: 0   citalopram (CELEXA) 40 MG tablet, Take 40 mg by mouth at bedtime., Disp: , Rfl:    doxycycline (VIBRA-TABS) 100 MG tablet, Take 1 tablet (100 mg total) by mouth 2 (two) times daily., Disp: 11 tablet, Rfl: 0   naproxen (NAPROSYN) 500 MG tablet, Take 1 tablet (500 mg total) by mouth 3 (three) times daily with meals., Disp: 60 tablet, Rfl: 1   omeprazole (PRILOSEC) 40 MG capsule, TAKE 1 CAPSULE BY MOUTH TWICE DAILY. TAKE FIRST DOSE 30 MINS PRIOR TO MORNINGMEAL., Disp: , Rfl:    polyethylene glycol powder (GLYCOLAX/MIRALAX) 17 GM/SCOOP powder, Take 17 g by mouth 2 (two) times daily., Disp: 238 g, Rfl: 0   pregabalin (LYRICA) 150 MG capsule, Take 150 mg by mouth 3 (three) times daily., Disp: , Rfl:    rOPINIRole (REQUIP) 2 MG tablet, Take 2 mg by mouth at bedtime., Disp: , Rfl:    senna (SENOKOT) 8.6 MG TABS tablet, Take 1 tablet (8.6 mg total) by mouth 2 (two) times daily., Disp: 120 tablet, Rfl: 0   traZODone (DESYREL) 100 MG tablet, Take 100-200 mg by mouth at bedtime as needed for sleep., Disp: , Rfl:    TRELEGY ELLIPTA 100-62.5-25 MCG/ACT AEPB, Inhale 1 puff into the lungs daily as needed (shortness of breath)., Disp: , Rfl:     Josephine Igo, DO [facility] 10/09/2022 2:01 PM

## 2022-10-10 ENCOUNTER — Institutional Professional Consult (permissible substitution): Payer: 59 | Admitting: Emergency Medicine

## 2022-10-28 ENCOUNTER — Emergency Department (HOSPITAL_COMMUNITY)
Admission: EM | Admit: 2022-10-28 | Discharge: 2022-10-29 | Disposition: A | Discharge status: Home / Self Care | Payer: 59 | Attending: Emergency Medicine | Admitting: Emergency Medicine

## 2022-10-28 ENCOUNTER — Other Ambulatory Visit: Payer: Self-pay

## 2022-10-28 DIAGNOSIS — D72829 Elevated white blood cell count, unspecified: Secondary | ICD-10-CM | POA: Insufficient documentation

## 2022-10-28 DIAGNOSIS — J449 Chronic obstructive pulmonary disease, unspecified: Secondary | ICD-10-CM | POA: Diagnosis not present

## 2022-10-28 DIAGNOSIS — K529 Noninfective gastroenteritis and colitis, unspecified: Secondary | ICD-10-CM

## 2022-10-28 DIAGNOSIS — R1030 Lower abdominal pain, unspecified: Secondary | ICD-10-CM | POA: Diagnosis not present

## 2022-10-28 DIAGNOSIS — K921 Melena: Secondary | ICD-10-CM | POA: Insufficient documentation

## 2022-10-28 DIAGNOSIS — Z7951 Long term (current) use of inhaled steroids: Secondary | ICD-10-CM | POA: Diagnosis not present

## 2022-10-28 LAB — COMPREHENSIVE METABOLIC PANEL
ALT: 17 U/L (ref 0–44)
AST: 23 U/L (ref 15–41)
Albumin: 3.5 g/dL (ref 3.5–5.0)
Alkaline Phosphatase: 71 U/L (ref 38–126)
Anion gap: 10 (ref 5–15)
BUN: 18 mg/dL (ref 6–20)
CO2: 23 mmol/L (ref 22–32)
Calcium: 8.7 mg/dL — ABNORMAL LOW (ref 8.9–10.3)
Chloride: 105 mmol/L (ref 98–111)
Creatinine, Ser: 0.82 mg/dL (ref 0.61–1.24)
GFR, Estimated: >60 mL/min (ref >60)
Glucose, Bld: 102 mg/dL — ABNORMAL HIGH (ref 70–99)
Potassium: 4.3 mmol/L (ref 3.5–5.1)
Sodium: 138 mmol/L (ref 135–145)
Total Bilirubin: 0.6 mg/dL (ref 0.3–1.2)
Total Protein: 6.4 g/dL — ABNORMAL LOW (ref 6.5–8.1)

## 2022-10-28 LAB — PROTIME-INR
INR: 1 (ref 0.8–1.2)
Prothrombin Time: 13.7 s (ref 11.4–15.2)

## 2022-10-28 LAB — CBC WITH DIFFERENTIAL/PLATELET
Abs Immature Granulocytes: 0.05 10*3/uL (ref 0.00–0.07)
Basophils Absolute: 0 10*3/uL (ref 0.0–0.1)
Basophils Relative: 0 %
Eosinophils Absolute: 0.2 10*3/uL (ref 0.0–0.5)
Eosinophils Relative: 2 %
HCT: 43.7 % (ref 39.0–52.0)
Hemoglobin: 13.5 g/dL (ref 13.0–17.0)
Immature Granulocytes: 0 %
Lymphocytes Relative: 10 %
Lymphs Abs: 1.4 10*3/uL (ref 0.7–4.0)
MCH: 27.7 pg (ref 26.0–34.0)
MCHC: 30.9 g/dL (ref 30.0–36.0)
MCV: 89.5 fL (ref 80.0–100.0)
Monocytes Absolute: 0.6 10*3/uL (ref 0.1–1.0)
Monocytes Relative: 4 %
Neutro Abs: 10.9 10*3/uL — ABNORMAL HIGH (ref 1.7–7.7)
Neutrophils Relative %: 84 %
Platelets: 235 10*3/uL (ref 150–400)
RBC: 4.88 MIL/uL (ref 4.22–5.81)
RDW: 14.7 % (ref 11.5–15.5)
WBC: 13.1 10*3/uL — ABNORMAL HIGH (ref 4.0–10.5)
nRBC: 0 % (ref 0.0–0.2)

## 2022-10-28 LAB — SAMPLE TO BLOOD BANK

## 2022-10-28 NOTE — ED Triage Notes (Signed)
Patient reports multiple bloody stools this morning with pain across lower abdomen , no emesis or diarrhea . No anticoagulant medications .

## 2022-10-29 ENCOUNTER — Emergency Department (HOSPITAL_COMMUNITY): Payer: 59

## 2022-10-29 DIAGNOSIS — K921 Melena: Secondary | ICD-10-CM | POA: Diagnosis not present

## 2022-10-29 LAB — HEMOGLOBIN AND HEMATOCRIT, BLOOD
HCT: 40.9 % (ref 39.0–52.0)
Hemoglobin: 12.8 g/dL — ABNORMAL LOW (ref 13.0–17.0)

## 2022-10-29 MED ORDER — FENTANYL CITRATE PF 50 MCG/ML IJ SOSY
50.0000 ug | PREFILLED_SYRINGE | Freq: Once | INTRAMUSCULAR | Status: AC
  Administered 2022-10-29: 50 ug via INTRAVENOUS
  Filled 2022-10-29: qty 1

## 2022-10-29 MED ORDER — DICYCLOMINE HCL 10 MG PO CAPS
10.0000 mg | ORAL_CAPSULE | Freq: Once | ORAL | Status: AC
  Administered 2022-10-29: 10 mg via ORAL
  Filled 2022-10-29: qty 1

## 2022-10-29 MED ORDER — ONDANSETRON HCL 4 MG/2ML IJ SOLN
4.0000 mg | Freq: Once | INTRAMUSCULAR | Status: AC
  Administered 2022-10-29: 4 mg via INTRAVENOUS
  Filled 2022-10-29: qty 2

## 2022-10-29 MED ORDER — ACETAMINOPHEN 500 MG PO TABS
1000.0000 mg | ORAL_TABLET | Freq: Once | ORAL | Status: AC
  Administered 2022-10-29: 1000 mg via ORAL
  Filled 2022-10-29: qty 2

## 2022-10-29 MED ORDER — DICYCLOMINE HCL 20 MG PO TABS: 20.0000 mg | ORAL_TABLET | Freq: Two times a day (BID) | ORAL | 0 refills | Status: AC

## 2022-10-29 MED ORDER — SODIUM CHLORIDE 0.9 % IV BOLUS
500.0000 mL | Freq: Once | INTRAVENOUS | Status: AC
  Administered 2022-10-29: 500 mL via INTRAVENOUS

## 2022-10-29 MED ORDER — AZITHROMYCIN 250 MG PO TABS: 500.0000 mg | ORAL_TABLET | Freq: Every day | ORAL | 0 refills | Status: AC

## 2022-10-29 MED ORDER — IOHEXOL 350 MG/ML SOLN
75.0000 mL | Freq: Once | INTRAVENOUS | Status: AC | PRN
  Administered 2022-10-29: 75 mL via INTRAVENOUS

## 2022-10-29 NOTE — ED Notes (Signed)
Pt reports bloody stools. Also reports lower abdominal pain.

## 2022-10-29 NOTE — ED Provider Notes (Signed)
[facility] EMERGENCY DEPARTMENT AT [facility] Provider Note   CSN: [id number] Arrival date & time: 10/28/22  2116     History  Chief Complaint  Patient presents with   Hematochezia    [name] is a 62 y.o. male.  The history is provided by the patient and medical records.  [name] is a 62 y.o. male who presents to the Emergency Department complaining of hematochezia.  He presents to the emergency department for evaluation of multiple episodes of dark and bloody stool that started last night.  He reports 3-4 episodes that are described as very large volume.  He also reports significant lower abdominal pain.  No associated fever, nausea, vomiting, difficulty breathing, lightheadedness, dysuria.  No prior similar symptoms.  He has a history of COPD.  He does not take any anticoagulation.  He does use tobacco.  He drinks occasional alcohol.  He also uses marijuana.  No NSAIDs. He believes he has had a colonoscopy but is unsure what facility was performed that or who performed the procedure.  He does state that there were polyps identified.  He is unsure about upper endoscopy.      Home Medications Prior to Admission medications   Medication Sig Start Date End Date Taking? Authorizing Provider  albuterol (VENTOLIN HFA) 108 (90 Base) MCG/ACT inhaler Inhale into the lungs.    [provider]  amitriptyline (ELAVIL) 75 MG tablet Take 75 mg by mouth at bedtime. 04/25/22   [provider]  amoxicillin-clavulanate (AUGMENTIN) 875-125 MG tablet Take 1 tablet by mouth 2 (two) times daily. 09/19/22   Lockie Mola, MD  citalopram (CELEXA) 40 MG tablet Take 40 mg by mouth at bedtime. 01/10/21   [provider]  doxycycline (VIBRA-TABS) 100 MG tablet Take 1 tablet (100 mg total) by mouth 2 (two) times daily. 09/19/22   Lockie Mola, MD  naproxen (NAPROSYN) 500 MG tablet Take 1 tablet (500 mg total) by mouth 3 (three) times daily with meals. 09/19/22  10/29/22  Lockie Mola, MD  omeprazole (PRILOSEC) 40 MG capsule TAKE 1 CAPSULE BY MOUTH TWICE DAILY. TAKE FIRST DOSE 30 MINS PRIOR TO MORNINGMEAL. 12/13/20   [provider]  polyethylene glycol powder (GLYCOLAX/MIRALAX) 17 GM/SCOOP powder Take 17 g by mouth 2 (two) times daily. 09/19/22   Lockie Mola, MD  pregabalin (LYRICA) 150 MG capsule Take 150 mg by mouth 3 (three) times daily. 06/27/22   [provider]  rOPINIRole (REQUIP) 2 MG tablet Take 2 mg by mouth at bedtime. 04/25/22   [provider]  senna (SENOKOT) 8.6 MG TABS tablet Take 1 tablet (8.6 mg total) by mouth 2 (two) times daily. 09/19/22   Lockie Mola, MD  traZODone (DESYREL) 100 MG tablet Take 100-200 mg by mouth at bedtime as needed for sleep.    [provider]  TRELEGY ELLIPTA 100-62.5-25 MCG/ACT AEPB Inhale 1 puff into the lungs daily as needed (shortness of breath). 06/23/22   [provider]      Allergies    Diovan hct [valsartan-hydrochlorothiazide] and Norvasc [amlodipine]    Review of Systems   Review of Systems  All other systems reviewed and are negative.   Physical Exam Updated Vital Signs BP 95/75 (BP Location: Right Arm)   Pulse 80   Temp 98.7 F (37.1 C)   Resp 17   SpO2 96%  Physical Exam Vitals and nursing note reviewed.  Constitutional:      Appearance: He is well-developed.  HENT:  Head: Normocephalic and atraumatic.  Cardiovascular:     Rate and Rhythm: Normal rate and regular rhythm.     Heart sounds: No murmur heard. Pulmonary:     Effort: Pulmonary effort is normal. No respiratory distress.     Breath sounds: Normal breath sounds.  Abdominal:     Palpations: Abdomen is soft.     Tenderness: There is no guarding or rebound.     Comments: Moderate generalized abdominal tenderness, greatest over the lower abdomen  Genitourinary:    Comments: No external hemorrhoids.  No gross blood. Musculoskeletal:        General: No tenderness.   Skin:    General: Skin is warm and dry.  Neurological:     Mental Status: He is alert and oriented to person, place, and time.  Psychiatric:        Behavior: Behavior normal.     ED Results / Procedures / Treatments   Labs (all labs ordered are listed, but only abnormal results are displayed) Labs Reviewed  CBC WITH DIFFERENTIAL/PLATELET - Abnormal; Notable for the following components:      Result Value   WBC 13.1 (*)    Neutro Abs 10.9 (*)    All other components within normal limits  COMPREHENSIVE METABOLIC PANEL - Abnormal; Notable for the following components:   Glucose, Bld 102 (*)    Calcium 8.7 (*)    Total Protein 6.4 (*)    All other components within normal limits  HEMOGLOBIN AND HEMATOCRIT, BLOOD - Abnormal; Notable for the following components:   Hemoglobin 12.8 (*)    All other components within normal limits  PROTIME-INR  SAMPLE TO BLOOD BANK    EKG None  Radiology No results found.  Procedures Procedures    Medications Ordered in ED Medications  sodium chloride 0.9 % bolus 500 mL (500 mLs Intravenous New Bag/Given 10/29/22 0643)  fentaNYL (SUBLIMAZE) injection 50 mcg (50 mcg Intravenous Given 10/29/22 0645)  ondansetron (ZOFRAN) injection 4 mg (4 mg Intravenous Given 10/29/22 1610)    ED Course/ Medical Decision Making/ A&P                                 Medical Decision Making Amount and/or Complexity of Data Reviewed Labs: ordered. Radiology: ordered.  Risk Prescription drug management.   Patient here for evaluation of lower abdominal pain, hematochezia.  He does have tenderness on examination without peritoneal findings.  He does not have active bleeding on DRE although this is a limited exam.  There is no evidence of hemorrhoids on exam.  CBC with leukocytosis, normal hemoglobin.  He is not on anticoagulants.  Plan to check repeat H&H, CT abdomen pelvis.  Patient care transferred pending CT.        Final Clinical Impression(s) /  ED Diagnoses Final diagnoses:  None    Rx / DC Orders ED Discharge Orders     None         Tilden Fossa, MD 10/29/22 [phone]

## 2022-10-29 NOTE — Discharge Instructions (Signed)
You can take Bentyl 3 times per day for abdominal cramping.  You can also take 1000 mg of Tylenol every 8 hours.  If your symptoms have not improved after 2 days, I sent a prescription for an antibiotic called azithromycin that you can take for 3 days.  This is most often caused by a virus, but rarely can be caused by bacteria.

## 2022-10-29 NOTE — ED Provider Notes (Signed)
  Physical Exam  BP 135/77 (BP Location: Right Arm)   Pulse 83   Temp 98.6 F (37 C) (Oral)   Resp 18   SpO2 97%   Physical Exam Abdominal:     General: Abdomen is flat.     Palpations: Abdomen is soft.     Tenderness: There is abdominal tenderness.     Procedures  Procedures  ED Course / MDM    Medical Decision Making I, Anders Simmonds, have assumed care for this patient.  In brief, 62 year old male presented to the emergency department due to several episodes of hematochezia.  Patient was signed out pending CT imaging the abdomen pelvis.  Patient CT scan showed colitis.  He does have some tenderness in his left lower quadrant.  I reviewed the patient's labs, slight drop in his hemoglobin over the 10-hour duration.  Patient hemodynamically stable.  Lower risk for more severe GI bleed.  Will discharge with Bentyl, Tylenol.  Will send the patient with a prescription for watch and wait ciprofloxacin.  Amount and/or Complexity of Data Reviewed Labs: ordered. Radiology: ordered.  Risk OTC drugs. Prescription drug management.          Anders Simmonds T, DO 10/29/22 [phone]

## 2022-10-31 LAB — ACID FAST CULTURE WITH REFLEXED SENSITIVITIES (MYCOBACTERIA): Acid Fast Culture: NEGATIVE

## 2022-11-02 LAB — FUNGUS CULTURE RESULT

## 2022-11-02 LAB — FUNGUS CULTURE WITH STAIN

## 2022-11-02 LAB — FUNGAL ORGANISM REFLEX

## 2022-11-03 LAB — FUNGUS CULTURE WITH STAIN

## 2022-11-03 LAB — FUNGUS CULTURE RESULT

## 2022-11-03 LAB — FUNGAL ORGANISM REFLEX

## 2022-11-17 LAB — ACID FAST CULTURE WITH REFLEXED SENSITIVITIES (MYCOBACTERIA)
Acid Fast Culture: NEGATIVE
Acid Fast Culture: NEGATIVE

## 2022-11-18 LAB — ACID FAST CULTURE WITH REFLEXED SENSITIVITIES (MYCOBACTERIA): Acid Fast Culture: NEGATIVE

## 2023-01-02 ENCOUNTER — Ambulatory Visit (HOSPITAL_BASED_OUTPATIENT_CLINIC_OR_DEPARTMENT_OTHER): Admission: RE | Admit: 2023-01-02 | Payer: 59 | Source: Ambulatory Visit

## 2023-01-08 ENCOUNTER — Ambulatory Visit: Payer: 59 | Admitting: Nurse Practitioner

## 2023-01-09 ENCOUNTER — Encounter: Payer: Self-pay | Admitting: Nurse Practitioner

## 2023-01-11 ENCOUNTER — Telehealth (HOSPITAL_BASED_OUTPATIENT_CLINIC_OR_DEPARTMENT_OTHER): Payer: Self-pay | Admitting: Pulmonary Disease

## 2023-01-18 ENCOUNTER — Telehealth (HOSPITAL_BASED_OUTPATIENT_CLINIC_OR_DEPARTMENT_OTHER): Payer: Self-pay | Admitting: Pulmonary Disease

## 2023-02-14 ENCOUNTER — Other Ambulatory Visit: Payer: Self-pay | Admitting: Neurological Surgery

## 2023-03-01 NOTE — Progress Notes (Signed)
Surgical Instructions   Your procedure is scheduled on Thursday, March 08, 2023.Marland Kitchen Report to [facility] Main Entrance "A" at 0800 A.M., then check in with the Admitting office. Any questions or running late day of surgery: call [phone]  Questions prior to your surgery date: call [phone], Monday-Friday, 8am-4pm. If you experience any cold or flu symptoms such as cough, fever, chills, shortness of breath, etc. between now and your scheduled surgery, please notify us at the above number.     Remember:  Do not eat or drink after midnight the night before your surgery   Take these medicines the morning of surgery with A SIP OF WATER  amitriptyline (ELAVIL)  citalopram (CELEXA)  omeprazole (PRILOSEC)  pregabalin (LYRICA)    May take these medicines IF NEEDED: albuterol (VENTOLIN HFA) inhaler - please bring with you the day of your surgery. TRELEGY ELLIPTA    One week prior to surgery, STOP taking any Aspirin (unless otherwise instructed by your surgeon) Aleve, Naproxen, Ibuprofen, Motrin, Advil, Goody's, BC's, all herbal medications, fish oil, and non-prescription vitamins.                     Do NOT Smoke (Tobacco/Vaping) for 24 hours prior to your procedure.  If you use a CPAP at night, you may bring your mask/headgear for your overnight stay.   You will be asked to remove any contacts, glasses, piercing's, hearing aid's, dentures/partials prior to surgery. Please bring cases for these items if needed.    Patients discharged the day of surgery will not be allowed to drive home, and someone needs to stay with them for 24 hours.  SURGICAL WAITING ROOM VISITATION Patients may have no more than 2 support people in the waiting area - these visitors may rotate.   Pre-op nurse will coordinate an appropriate time for 1 ADULT support person, who may not rotate, to accompany patient in pre-op.  Children under the age of 90 must have an adult with them who is not the patient and  must remain in the main waiting area with an adult.  If the patient needs to stay at the hospital during part of their recovery, the visitor guidelines for inpatient rooms apply.  Please refer to the [facility] website for the visitor guidelines for any additional information.   If you received a COVID test during your pre-op visit  it is requested that you wear a mask when out in public, stay away from anyone that may not be feeling well and notify your surgeon if you develop symptoms. If you have been in contact with anyone that has tested positive in the last 10 days please notify you surgeon.      Pre-operative CHG Bathing Instructions   You can play a key role in reducing the risk of infection after surgery. Your skin needs to be as free of germs as possible. You can reduce the number of germs on your skin by washing with CHG (chlorhexidine gluconate) soap before surgery. CHG is an antiseptic soap that kills germs and continues to kill germs even after washing.   DO NOT use if you have an allergy to chlorhexidine/CHG or antibacterial soaps. If your skin becomes reddened or irritated, stop using the CHG and notify one of our RNs at [phone].              TAKE A SHOWER THE NIGHT BEFORE SURGERY AND THE DAY OF SURGERY    Please keep in mind the following:  DO NOT shave, including legs and underarms, 48 hours prior to surgery.   You may shave your face before/day of surgery.  Place clean sheets on your bed the night before surgery Use a clean washcloth (not used since being washed) for each shower. DO NOT sleep with pet's night before surgery.  CHG Shower Instructions:  Wash your face and private area with normal soap. If you choose to wash your hair, wash first with your normal shampoo.  After you use shampoo/soap, rinse your hair and body thoroughly to remove shampoo/soap residue.  Turn the water OFF and apply half the bottle of CHG soap to a CLEAN washcloth.  Apply CHG soap  ONLY FROM YOUR NECK DOWN TO YOUR TOES (washing for 3-5 minutes)  DO NOT use CHG soap on face, private areas, open wounds, or sores.  Pay special attention to the area where your surgery is being performed.  If you are having back surgery, having someone wash your back for you may be helpful. Wait 2 minutes after CHG soap is applied, then you may rinse off the CHG soap.  Pat dry with a clean towel  Put on clean pajamas    Additional instructions for the day of surgery: DO NOT APPLY any lotions, deodorants, cologne, or perfumes.   Do not wear jewelry or makeup Do not wear nail polish, gel polish, artificial nails, or any other type of covering on natural nails (fingers and toes) Do not bring valuables to the hospital. [facility] is not responsible for valuables/personal belongings. Put on clean/comfortable clothes.  Please brush your teeth.  Ask your nurse before applying any prescription medications to the skin.

## 2023-03-02 ENCOUNTER — Inpatient Hospital Stay (HOSPITAL_COMMUNITY)
Admission: RE | Admit: 2023-03-02 | Discharge: 2023-03-02 | Disposition: A | Discharge status: Home / Self Care | Payer: 59 | Source: Ambulatory Visit

## 2023-03-06 ENCOUNTER — Encounter (HOSPITAL_COMMUNITY): Payer: Self-pay

## 2023-03-06 ENCOUNTER — Other Ambulatory Visit: Payer: Self-pay

## 2023-03-06 ENCOUNTER — Other Ambulatory Visit: Payer: Self-pay | Admitting: Neurological Surgery

## 2023-03-06 ENCOUNTER — Encounter (HOSPITAL_COMMUNITY)
Admission: RE | Admit: 2023-03-06 | Discharge: 2023-03-06 | Disposition: A | Discharge status: Home / Self Care | Payer: 59 | Source: Ambulatory Visit | Attending: Neurological Surgery | Admitting: Neurological Surgery

## 2023-03-06 VITALS — BP 147/87 | HR 97 | Temp 98.4°F | Resp 18 | Ht 71.0 in | Wt 246.6 lb

## 2023-03-06 DIAGNOSIS — Z01812 Encounter for preprocedural laboratory examination: Secondary | ICD-10-CM | POA: Insufficient documentation

## 2023-03-06 DIAGNOSIS — I10 Essential (primary) hypertension: Secondary | ICD-10-CM | POA: Insufficient documentation

## 2023-03-06 DIAGNOSIS — J449 Chronic obstructive pulmonary disease, unspecified: Secondary | ICD-10-CM | POA: Insufficient documentation

## 2023-03-06 DIAGNOSIS — G4733 Obstructive sleep apnea (adult) (pediatric): Secondary | ICD-10-CM | POA: Insufficient documentation

## 2023-03-06 DIAGNOSIS — F17200 Nicotine dependence, unspecified, uncomplicated: Secondary | ICD-10-CM | POA: Insufficient documentation

## 2023-03-06 DIAGNOSIS — B1710 Acute hepatitis C without hepatic coma: Secondary | ICD-10-CM

## 2023-03-06 DIAGNOSIS — F1910 Other psychoactive substance abuse, uncomplicated: Secondary | ICD-10-CM | POA: Insufficient documentation

## 2023-03-06 DIAGNOSIS — Z01818 Encounter for other preprocedural examination: Secondary | ICD-10-CM

## 2023-03-06 HISTORY — DX: Other nonspecific abnormal finding of lung field: R91.8

## 2023-03-06 HISTORY — DX: Depression, unspecified: F32.A

## 2023-03-06 HISTORY — DX: Unspecified viral hepatitis C without hepatic coma: B19.20

## 2023-03-06 LAB — SURGICAL PCR SCREEN
MRSA, PCR: NEGATIVE
Staphylococcus aureus: NEGATIVE

## 2023-03-06 LAB — COMPREHENSIVE METABOLIC PANEL
ALT: 13 U/L (ref 0–44)
AST: 19 U/L (ref 15–41)
Albumin: 3.2 g/dL — ABNORMAL LOW (ref 3.5–5.0)
Alkaline Phosphatase: 51 U/L (ref 38–126)
Anion gap: 7 (ref 5–15)
BUN: 12 mg/dL (ref 8–23)
CO2: 28 mmol/L (ref 22–32)
Calcium: 8.9 mg/dL (ref 8.9–10.3)
Chloride: 105 mmol/L (ref 98–111)
Creatinine, Ser: 0.97 mg/dL (ref 0.61–1.24)
GFR, Estimated: >60 mL/min (ref >60)
Glucose, Bld: 98 mg/dL (ref 70–99)
Potassium: 3.9 mmol/L (ref 3.5–5.1)
Sodium: 140 mmol/L (ref 135–145)
Total Bilirubin: 0.5 mg/dL (ref 0.0–1.2)
Total Protein: 5.9 g/dL — ABNORMAL LOW (ref 6.5–8.1)

## 2023-03-06 LAB — CBC
HCT: 43.2 % (ref 39.0–52.0)
Hemoglobin: 14 g/dL (ref 13.0–17.0)
MCH: 29.7 pg (ref 26.0–34.0)
MCHC: 32.4 g/dL (ref 30.0–36.0)
MCV: 91.7 fL (ref 80.0–100.0)
Platelets: 216 10*3/uL (ref 150–400)
RBC: 4.71 MIL/uL (ref 4.22–5.81)
RDW: 14.6 % (ref 11.5–15.5)
WBC: 7.3 10*3/uL (ref 4.0–10.5)
nRBC: 0 % (ref 0.0–0.2)

## 2023-03-06 LAB — TYPE AND SCREEN
ABO/RH(D): A NEG
Antibody Screen: NEGATIVE

## 2023-03-06 NOTE — Progress Notes (Signed)
 PCP - [facility] [place]- pt thinks provider's first name is Hotel Manager - denies Pulmonologist- Dr. Adine Icard   PPM/ICD - denies   Chest x-ray - 10/03/22 EKG - 09/15/22 Stress Test - denies ECHO - 09/19/22 Cardiac Cath - denies  Sleep Study - OSA+ CPAP - denies  DM- denies  Last dose of GLP1 agonist-  n/a   ASA/Blood Thinner Instructions: n/a   ERAS Protcol - no, NPO   COVID TEST- n/a   Anesthesia review: yes, pulmonary hx. Pt had f/u appt (no-show) in December. Asked pt about this and he stated he was not sure he sees so many doctors.  Patient denies shortness of breath, fever, cough and chest pain at PAT appointment   All instructions explained to the patient, with a verbal understanding of the material. Patient agrees to go over the instructions while at home for a better understanding.  The opportunity to ask questions was provided.

## 2023-03-06 NOTE — Pre-Procedure Instructions (Signed)
 Surgical Instructions   Your procedure is scheduled on Thursday, February 6th. Report to [facility] Main Entrance A at 07:50 A.M., then check in with the Admitting office. Any questions or running late day of surgery: call [phone]  Questions prior to your surgery date: call [phone], Monday-Friday, 8am-4pm. If you experience any cold or flu symptoms such as cough, fever, chills, shortness of breath, etc. between now and your scheduled surgery, please notify us  at the above number.     Remember:  Do not eat or drink after midnight the night before your surgery    Take these medicines the morning of surgery with A SIP OF WATER  amLODipine  (NORVASC )  omeprazole (PRILOSEC)  pregabalin  (LYRICA )  dicyclomine  (BENTYL )  doxycycline  (VIBRA -TABS)  omeprazole (PRILOSEC)    May take these medicines IF NEEDED: albuterol  (VENTOLIN  HFA) inhaler - please bring with you the day of your surgery. TRELEGY ELLIPTA  Oxycodone  HCl     One week prior to surgery, STOP taking any Aspirin (unless otherwise instructed by your surgeon) Aleve , Naproxen , Ibuprofen, Motrin, Advil, Goody's, BC's, all herbal medications, fish oil, and non-prescription vitamins.                     Do NOT Smoke (Tobacco/Vaping) for 24 hours prior to your procedure.  If you use a CPAP at night, you may bring your mask/headgear for your overnight stay.   You will be asked to remove any contacts, glasses, piercing's, hearing aid's, dentures/partials prior to surgery. Please bring cases for these items if needed.    Patients discharged the day of surgery will not be allowed to drive home, and someone needs to stay with them for 24 hours.  SURGICAL WAITING ROOM VISITATION Patients may have no more than 2 support people in the waiting area - these visitors may rotate.   Pre-op nurse will coordinate an appropriate time for 1 ADULT support person, who may not rotate, to accompany patient in pre-op.  Children under the  age of 71 must have an adult with them who is not the patient and must remain in the main waiting area with an adult.  If the patient needs to stay at the hospital during part of their recovery, the visitor guidelines for inpatient rooms apply.  Please refer to the [facility] website for the visitor guidelines for any additional information.   If you received a COVID test during your pre-op visit  it is requested that you wear a mask when out in public, stay away from anyone that may not be feeling well and notify your surgeon if you develop symptoms. If you have been in contact with anyone that has tested positive in the last 10 days please notify you surgeon.      Pre-operative 5 CHG Bathing Instructions   You can play a key role in reducing the risk of infection after surgery. Your skin needs to be as free of germs as possible. You can reduce the number of germs on your skin by washing with CHG (chlorhexidine  gluconate) soap before surgery. CHG is an antiseptic soap that kills germs and continues to kill germs even after washing.   DO NOT use if you have an allergy to chlorhexidine /CHG or antibacterial soaps. If your skin becomes reddened or irritated, stop using the CHG and notify one of our RNs at [phone].   Please shower with the CHG soap starting 4 days before surgery using the following schedule:     Please keep  in mind the following:  DO NOT shave, including legs and underarms, starting the day of your first shower.   You may shave your face at any point before/day of surgery.  Place clean sheets on your bed the day you start using CHG soap. Use a clean washcloth (not used since being washed) for each shower. DO NOT sleep with pets once you start using the CHG.   CHG Shower Instructions:  Wash your face and private area with normal soap. If you choose to wash your hair, wash first with your normal shampoo.  After you use shampoo/soap, rinse your hair and body thoroughly  to remove shampoo/soap residue.  Turn the water OFF and apply about 3 tablespoons (45 ml) of CHG soap to a CLEAN washcloth.  Apply CHG soap ONLY FROM YOUR NECK DOWN TO YOUR TOES (washing for 3-5 minutes)  DO NOT use CHG soap on face, private areas, open wounds, or sores.  Pay special attention to the area where your surgery is being performed.  If you are having back surgery, having someone wash your back for you may be helpful. Wait 2 minutes after CHG soap is applied, then you may rinse off the CHG soap.  Pat dry with a clean towel  Put on clean clothes/pajamas   If you choose to wear lotion, please use ONLY the CHG-compatible lotions that are listed below.  Additional instructions for the day of surgery: DO NOT APPLY any lotions, deodorants, cologne, or perfumes.   Do not bring valuables to the hospital. [facility] is not responsible for any belongings/valuables. Do not wear nail polish, gel polish, artificial nails, or any other type of covering on natural nails (fingers and toes) Do not wear jewelry or makeup Put on clean/comfortable clothes.  Please brush your teeth.  Ask your nurse before applying any prescription medications to the skin.     CHG Compatible Lotions   Aveeno Moisturizing lotion  Cetaphil Moisturizing Cream  Cetaphil Moisturizing Lotion  Clairol Herbal Essence Moisturizing Lotion, Dry Skin  Clairol Herbal Essence Moisturizing Lotion, Extra Dry Skin  Clairol Herbal Essence Moisturizing Lotion, Normal Skin  Curel Age Defying Therapeutic Moisturizing Lotion with Alpha Hydroxy  Curel Extreme Care Body Lotion  Curel Soothing Hands Moisturizing Hand Lotion  Curel Therapeutic Moisturizing Cream, Fragrance-Free  Curel Therapeutic Moisturizing Lotion, Fragrance-Free  Curel Therapeutic Moisturizing Lotion, Original Formula  Eucerin Daily Replenishing Lotion  Eucerin Dry Skin Therapy Plus Alpha Hydroxy Crme  Eucerin Dry Skin Therapy Plus Alpha Hydroxy Lotion   Eucerin Original Crme  Eucerin Original Lotion  Eucerin Plus Crme Eucerin Plus Lotion  Eucerin TriLipid Replenishing Lotion  Keri Anti-Bacterial Hand Lotion  Keri Deep Conditioning Original Lotion Dry Skin Formula Softly Scented  Keri Deep Conditioning Original Lotion, Fragrance Free Sensitive Skin Formula  Keri Lotion Fast Absorbing Fragrance Free Sensitive Skin Formula  Keri Lotion Fast Absorbing Softly Scented Dry Skin Formula  Keri Original Lotion  Keri Skin Renewal Lotion Keri Silky Smooth Lotion  Keri Silky Smooth Sensitive Skin Lotion  Nivea Body Creamy Conditioning Oil  Nivea Body Extra Enriched Lotion  Nivea Body Original Lotion  Nivea Body Sheer Moisturizing Lotion Nivea Crme  Nivea Skin Firming Lotion  NutraDerm 30 Skin Lotion  NutraDerm Skin Lotion  NutraDerm Therapeutic Skin Cream  NutraDerm Therapeutic Skin Lotion  ProShield Protective Hand Cream  Provon moisturizing lotion  Please read over the following fact sheets that you were given.

## 2023-03-07 NOTE — Anesthesia Preprocedure Evaluation (Addendum)
 Anesthesia Evaluation  Patient identified by MRN, date of birth, ID band Patient awake    Reviewed: Allergy & Precautions, NPO status , Patient's Chart, lab work & pertinent test results  History of Anesthesia Complications Negative for: history of anesthetic complications  Airway Mallampati: II  TM Distance: >3 FB Neck ROM: Limited    Dental  (+) Dental Advisory Given   Pulmonary sleep apnea , COPD,  COPD inhaler, Current SmokerPatient did not abstain from smoking.   Pulmonary exam normal        Cardiovascular hypertension, Pt. on medications Normal cardiovascular exam   '24 TEE - EF 55 to 60%. Mild mitral valve regurgitation. Aortic valve regurgitation is trivial. Agitated saline contrast bubble study was negative, with no evidence of any interatrial shunt.     Neuro/Psych  PSYCHIATRIC DISORDERS Anxiety Depression       GI/Hepatic ,GERD  Medicated and Controlled,,(+)     substance abuse  marijuana use and IV drug use, Hepatitis -, C  Endo/Other  diabetes   Obesity   Renal/GU negative Renal ROS     Musculoskeletal  (+) Arthritis ,  narcotic dependent  Abdominal   Peds  Hematology negative hematology ROS (+)   Anesthesia Other Findings   Reproductive/Obstetrics                             Anesthesia Physical Anesthesia Plan  ASA: 3  Anesthesia Plan: General   Post-op Pain Management: Tylenol  PO (pre-op)* and Celebrex  PO (pre-op)*   Induction: Intravenous  PONV Risk Score and Plan: 1 and Treatment may vary due to age or medical condition, Ondansetron , Dexamethasone  and Midazolam   Airway Management Planned: Oral ETT and Video Laryngoscope Planned  Additional Equipment: None  Intra-op Plan:   Post-operative Plan: Extubation in OR  Informed Consent: I have reviewed the patients History and Physical, chart, labs and discussed the procedure including the risks, benefits and  alternatives for the proposed anesthesia with the patient or authorized representative who has indicated his/her understanding and acceptance.     Dental advisory given  Plan Discussed with: CRNA and Anesthesiologist  Anesthesia Plan Comments: (PAT note by Lynwood Hope, PA-C)        Anesthesia Quick Evaluation

## 2023-03-07 NOTE — Progress Notes (Addendum)
 Anesthesia Chart Review:  63 year old male with history including current smoker with associated COPD, OSA not on CPAP, HTN, polysubstance abuse, LLL abscess with lingular resection 2007.  Recently admitted in August 2024 for right sided pleuritic chest and back pain. Troponin and lactic acid unremarkable. CXR revealed a right middle/upper lobe opacity with increased interstitial markings. CT chest without contrast demonstrated irregular pulmonary nodules bilaterally with very early cavitation on the left concerning for septic emboli/hematogenous pneumonia versus metastatic disease. CTA chest was concerning for pulmonary metastasis with a coarse contour of the liver suggestive of cirrhosis. Pulmonology consulted and obtained labs which showed negative autoimmune workup for scleroderma, SLE, sarcoidosis, vasculitides, as well as infectious etiologies such as TB and legionella. He was started on cefepime  and vancomycin  empirically per pharmacy (8/16-8/20) for possible endocarditis with septic embolism. Blood cultures obtained and showed NG at 4 days. Obtained a TTE to evaluate for endocarditis which unfortunately had poor windows and Cardiology performed TEE, which showed no evidence of endocarditis, no LA/LAA thrombus, and EF 60-65%. Patient was discharged on oral Augmentin  875-125 mg twice daily for 5 days and oral doxycycline  100 mg twice daily for 5 days (8/20-8/25) with outpatient Pulmonology follow up for possible bronchoscopy.   Patient did subsequently undergo bronchoscopy 10/03/2022; biopsies were negative.  He was seen in follow-up by Dr. Brenna on 10/09/2022 and recommended to follow-up in around 3 months for pulmonary function testing and repeat CT.  He has not had these yet.  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  Remote history of C4-7 ACDF via 3 separate surgeries.  Preop labs reviewed, unremarkable.  EKG 09/15/2022: NSR.  Rate 63.  Nonspecific T wave  abnormalities.  TEE 09/19/2022:  1. Left ventricular ejection fraction, by estimation, is 55 to 60%. The  left ventricle has normal function. The left ventricle has no regional  wall motion abnormalities.   2. Right ventricular systolic function is normal. The right ventricular  size is normal.   3. No left atrial/left atrial appendage thrombus was detected.   4. The mitral valve is normal in structure. Mild mitral valve  regurgitation. No evidence of mitral stenosis.   5. The aortic valve is tricuspid. Aortic valve regurgitation is trivial.  No aortic stenosis is present.   6. The inferior vena cava is normal in size with greater than 50%  respiratory variability, suggesting right atrial pressure of 3 mmHg.   7. Agitated saline contrast bubble study was negative, with no evidence  of any interatrial shunt.   Conclusion(s)/Recommendation(s): Normal biventricular function without  evidence of hemodynamically significant valvular heart disease.      Lynwood Geofm RIGGERS [facility] Short Stay Center/Anesthesiology Phone [phone] 03/07/2023 9:15 AM

## 2023-03-08 ENCOUNTER — Inpatient Hospital Stay (HOSPITAL_COMMUNITY): Payer: 59

## 2023-03-08 ENCOUNTER — Inpatient Hospital Stay (HOSPITAL_COMMUNITY): Payer: 59 | Admitting: Anesthesiology

## 2023-03-08 ENCOUNTER — Inpatient Hospital Stay (HOSPITAL_COMMUNITY)
Admission: RE | Admit: 2023-03-08 | Discharge: 2023-03-09 | DRG: 472 | Disposition: A | Discharge status: Home / Self Care | Payer: 59 | Source: Ambulatory Visit | Attending: Neurological Surgery | Admitting: Neurological Surgery

## 2023-03-08 ENCOUNTER — Inpatient Hospital Stay (HOSPITAL_COMMUNITY): Payer: 59 | Admitting: Physician Assistant

## 2023-03-08 ENCOUNTER — Encounter (HOSPITAL_COMMUNITY): Payer: Self-pay | Admitting: Neurological Surgery

## 2023-03-08 ENCOUNTER — Other Ambulatory Visit: Payer: Self-pay

## 2023-03-08 ENCOUNTER — Encounter (HOSPITAL_COMMUNITY)
Admission: RE | Disposition: A | Discharge status: Home / Self Care | Payer: Self-pay | Source: Ambulatory Visit | Attending: Neurological Surgery

## 2023-03-08 DIAGNOSIS — Z801 Family history of malignant neoplasm of trachea, bronchus and lung: Secondary | ICD-10-CM | POA: Diagnosis not present

## 2023-03-08 DIAGNOSIS — K219 Gastro-esophageal reflux disease without esophagitis: Secondary | ICD-10-CM | POA: Diagnosis present

## 2023-03-08 DIAGNOSIS — E119 Type 2 diabetes mellitus without complications: Secondary | ICD-10-CM | POA: Diagnosis present

## 2023-03-08 DIAGNOSIS — E669 Obesity, unspecified: Secondary | ICD-10-CM | POA: Diagnosis present

## 2023-03-08 DIAGNOSIS — Z01812 Encounter for preprocedural laboratory examination: Secondary | ICD-10-CM

## 2023-03-08 DIAGNOSIS — Y831 Surgical operation with implant of artificial internal device as the cause of abnormal reaction of the patient, or of later complication, without mention of misadventure at the time of the procedure: Secondary | ICD-10-CM | POA: Diagnosis present

## 2023-03-08 DIAGNOSIS — Z888 Allergy status to other drugs, medicaments and biological substances status: Secondary | ICD-10-CM | POA: Diagnosis not present

## 2023-03-08 DIAGNOSIS — M960 Pseudarthrosis after fusion or arthrodesis: Secondary | ICD-10-CM | POA: Diagnosis present

## 2023-03-08 DIAGNOSIS — Z8619 Personal history of other infectious and parasitic diseases: Secondary | ICD-10-CM | POA: Diagnosis not present

## 2023-03-08 DIAGNOSIS — Z902 Acquired absence of lung [part of]: Secondary | ICD-10-CM | POA: Diagnosis not present

## 2023-03-08 DIAGNOSIS — F17210 Nicotine dependence, cigarettes, uncomplicated: Secondary | ICD-10-CM | POA: Diagnosis present

## 2023-03-08 DIAGNOSIS — M4802 Spinal stenosis, cervical region: Secondary | ICD-10-CM | POA: Diagnosis present

## 2023-03-08 DIAGNOSIS — G4733 Obstructive sleep apnea (adult) (pediatric): Secondary | ICD-10-CM | POA: Diagnosis present

## 2023-03-08 DIAGNOSIS — I10 Essential (primary) hypertension: Secondary | ICD-10-CM | POA: Diagnosis present

## 2023-03-08 DIAGNOSIS — R490 Dysphonia: Secondary | ICD-10-CM | POA: Diagnosis present

## 2023-03-08 DIAGNOSIS — M4722 Other spondylosis with radiculopathy, cervical region: Secondary | ICD-10-CM | POA: Diagnosis present

## 2023-03-08 DIAGNOSIS — Z79899 Other long term (current) drug therapy: Secondary | ICD-10-CM

## 2023-03-08 DIAGNOSIS — M4712 Other spondylosis with myelopathy, cervical region: Secondary | ICD-10-CM | POA: Diagnosis present

## 2023-03-08 DIAGNOSIS — F1910 Other psychoactive substance abuse, uncomplicated: Secondary | ICD-10-CM | POA: Diagnosis present

## 2023-03-08 DIAGNOSIS — J449 Chronic obstructive pulmonary disease, unspecified: Secondary | ICD-10-CM | POA: Diagnosis present

## 2023-03-08 DIAGNOSIS — Z6834 Body mass index (BMI) 34.0-34.9, adult: Secondary | ICD-10-CM | POA: Diagnosis not present

## 2023-03-08 DIAGNOSIS — R918 Other nonspecific abnormal finding of lung field: Secondary | ICD-10-CM | POA: Diagnosis present

## 2023-03-08 HISTORY — PX: POSTERIOR CERVICAL FUSION/FORAMINOTOMY: SHX5038

## 2023-03-08 SURGERY — POSTERIOR CERVICAL FUSION/FORAMINOTOMY LEVEL 3
Anesthesia: General | Site: Spine Cervical

## 2023-03-08 MED ORDER — OXYCODONE HCL 5 MG PO TABS
ORAL_TABLET | ORAL | Status: AC
  Filled 2023-03-08: qty 1

## 2023-03-08 MED ORDER — FENTANYL CITRATE (PF) 100 MCG/2ML IJ SOLN
INTRAMUSCULAR | Status: AC
  Filled 2023-03-08: qty 2

## 2023-03-08 MED ORDER — SODIUM CHLORIDE 0.9% FLUSH
3.0000 mL | Freq: Two times a day (BID) | INTRAVENOUS | Status: DC
  Administered 2023-03-08: 3 mL via INTRAVENOUS

## 2023-03-08 MED ORDER — FENTANYL CITRATE (PF) 250 MCG/5ML IJ SOLN
INTRAMUSCULAR | Status: AC
  Filled 2023-03-08: qty 5

## 2023-03-08 MED ORDER — FENTANYL CITRATE (PF) 250 MCG/5ML IJ SOLN
INTRAMUSCULAR | Status: DC | PRN
  Administered 2023-03-08 (×2): 50 ug via INTRAVENOUS
  Administered 2023-03-08: 150 ug via INTRAVENOUS

## 2023-03-08 MED ORDER — THROMBIN 5000 UNITS EX SOLR: OROMUCOSAL | Status: DC | PRN

## 2023-03-08 MED ORDER — MENTHOL 3 MG MT LOZG: 1.0000 | LOZENGE | OROMUCOSAL | Status: DC | PRN

## 2023-03-08 MED ORDER — PHENYLEPHRINE HCL-NACL 20-0.9 MG/250ML-% IV SOLN
INTRAVENOUS | Status: DC | PRN
  Administered 2023-03-08: 20 ug/min via INTRAVENOUS

## 2023-03-08 MED ORDER — AMITRIPTYLINE HCL 25 MG PO TABS
75.0000 mg | ORAL_TABLET | Freq: Every day | ORAL | Status: DC
  Administered 2023-03-08: 75 mg via ORAL
  Filled 2023-03-08: qty 1
  Filled 2023-03-08: qty 3

## 2023-03-08 MED ORDER — METHOCARBAMOL 500 MG PO TABS
ORAL_TABLET | ORAL | Status: AC
  Filled 2023-03-08: qty 1

## 2023-03-08 MED ORDER — FLUTICASONE FUROATE-VILANTEROL 100-25 MCG/ACT IN AEPB
1.0000 | INHALATION_SPRAY | Freq: Every day | RESPIRATORY_TRACT | Status: DC
  Administered 2023-03-09: 1 via RESPIRATORY_TRACT
  Filled 2023-03-08 (×2): qty 28

## 2023-03-08 MED ORDER — AMISULPRIDE (ANTIEMETIC) 5 MG/2ML IV SOLN: 10.0000 mg | Freq: Once | INTRAVENOUS | Status: DC | PRN

## 2023-03-08 MED ORDER — CEFAZOLIN SODIUM-DEXTROSE 2-4 GM/100ML-% IV SOLN
2.0000 g | INTRAVENOUS | Status: AC
  Administered 2023-03-08: 2 g via INTRAVENOUS
  Filled 2023-03-08: qty 100

## 2023-03-08 MED ORDER — KETAMINE HCL 50 MG/5ML IJ SOSY
PREFILLED_SYRINGE | INTRAMUSCULAR | Status: AC
  Filled 2023-03-08: qty 5

## 2023-03-08 MED ORDER — SODIUM CHLORIDE 0.9% FLUSH: 3.0000 mL | INTRAVENOUS | Status: DC | PRN

## 2023-03-08 MED ORDER — OXYCODONE HCL 5 MG PO TABS
10.0000 mg | ORAL_TABLET | ORAL | Status: DC | PRN
  Administered 2023-03-08: 10 mg via ORAL
  Administered 2023-03-08 – 2023-03-09 (×3): 20 mg via ORAL
  Filled 2023-03-08 (×3): qty 4
  Filled 2023-03-08: qty 2

## 2023-03-08 MED ORDER — ROCURONIUM BROMIDE 10 MG/ML (PF) SYRINGE
PREFILLED_SYRINGE | INTRAVENOUS | Status: AC
  Filled 2023-03-08: qty 10

## 2023-03-08 MED ORDER — SUGAMMADEX SODIUM 200 MG/2ML IV SOLN
INTRAVENOUS | Status: DC | PRN
  Administered 2023-03-08: 200 mg via INTRAVENOUS

## 2023-03-08 MED ORDER — PANTOPRAZOLE SODIUM 40 MG PO TBEC
80.0000 mg | DELAYED_RELEASE_TABLET | Freq: Every day | ORAL | Status: DC
  Administered 2023-03-08 – 2023-03-09 (×2): 80 mg via ORAL
  Filled 2023-03-08 (×2): qty 2

## 2023-03-08 MED ORDER — SODIUM CHLORIDE 0.9 % IV SOLN: 12.5000 mg | INTRAVENOUS | Status: DC | PRN

## 2023-03-08 MED ORDER — LIDOCAINE 2% (20 MG/ML) 5 ML SYRINGE
INTRAMUSCULAR | Status: AC
  Filled 2023-03-08: qty 5

## 2023-03-08 MED ORDER — CHLORHEXIDINE GLUCONATE CLOTH 2 % EX PADS: 6.0000 | MEDICATED_PAD | Freq: Once | CUTANEOUS | Status: DC

## 2023-03-08 MED ORDER — BUPIVACAINE-EPINEPHRINE (PF) 0.5% -1:200000 IJ SOLN
INTRAMUSCULAR | Status: AC
  Filled 2023-03-08: qty 30

## 2023-03-08 MED ORDER — PHENOL 1.4 % MT LIQD: 1.0000 | OROMUCOSAL | Status: DC | PRN

## 2023-03-08 MED ORDER — UMECLIDINIUM BROMIDE 62.5 MCG/ACT IN AEPB
1.0000 | INHALATION_SPRAY | Freq: Every day | RESPIRATORY_TRACT | Status: DC
  Administered 2023-03-09: 1 via RESPIRATORY_TRACT
  Filled 2023-03-08 (×2): qty 7

## 2023-03-08 MED ORDER — AMLODIPINE BESYLATE 5 MG PO TABS
5.0000 mg | ORAL_TABLET | Freq: Two times a day (BID) | ORAL | Status: DC
  Administered 2023-03-08 (×2): 5 mg via ORAL
  Filled 2023-03-08 (×4): qty 1

## 2023-03-08 MED ORDER — BUPIVACAINE LIPOSOME 1.3 % IJ SUSP
INTRAMUSCULAR | Status: AC
Start: 2023-03-08 — End: ?
  Filled 2023-03-08: qty 20

## 2023-03-08 MED ORDER — BACITRACIN ZINC 500 UNIT/GM EX OINT
TOPICAL_OINTMENT | CUTANEOUS | Status: AC
  Filled 2023-03-08: qty 28.35

## 2023-03-08 MED ORDER — ROCURONIUM BROMIDE 10 MG/ML (PF) SYRINGE
PREFILLED_SYRINGE | INTRAVENOUS | Status: DC | PRN
  Administered 2023-03-08: 60 mg via INTRAVENOUS

## 2023-03-08 MED ORDER — DEXAMETHASONE SODIUM PHOSPHATE 10 MG/ML IJ SOLN
INTRAMUSCULAR | Status: DC | PRN
  Administered 2023-03-08: 10 mg via INTRAVENOUS

## 2023-03-08 MED ORDER — CELECOXIB 200 MG PO CAPS: 200.0000 mg | ORAL_CAPSULE | Freq: Once | ORAL | Status: DC

## 2023-03-08 MED ORDER — OXYCODONE HCL 5 MG PO TABS
5.0000 mg | ORAL_TABLET | Freq: Once | ORAL | Status: AC | PRN
  Administered 2023-03-08: 5 mg via ORAL

## 2023-03-08 MED ORDER — BUPIVACAINE LIPOSOME 1.3 % IJ SUSP
INTRAMUSCULAR | Status: DC | PRN
  Administered 2023-03-08: 20 mL

## 2023-03-08 MED ORDER — KETOROLAC TROMETHAMINE 15 MG/ML IJ SOLN
15.0000 mg | Freq: Four times a day (QID) | INTRAMUSCULAR | Status: DC
  Administered 2023-03-08 – 2023-03-09 (×3): 15 mg via INTRAVENOUS
  Filled 2023-03-08 (×3): qty 1

## 2023-03-08 MED ORDER — LIDOCAINE-EPINEPHRINE 1 %-1:100000 IJ SOLN
INTRAMUSCULAR | Status: DC | PRN
  Administered 2023-03-08: 5 mL

## 2023-03-08 MED ORDER — ONDANSETRON HCL 4 MG PO TABS: 4.0000 mg | ORAL_TABLET | Freq: Four times a day (QID) | ORAL | Status: DC | PRN

## 2023-03-08 MED ORDER — CHLORHEXIDINE GLUCONATE 0.12 % MT SOLN
15.0000 mL | Freq: Once | OROMUCOSAL | Status: AC
  Administered 2023-03-08: 15 mL via OROMUCOSAL
  Filled 2023-03-08: qty 15

## 2023-03-08 MED ORDER — ORAL CARE MOUTH RINSE: 15.0000 mL | Freq: Once | OROMUCOSAL | Status: AC

## 2023-03-08 MED ORDER — ROPINIROLE HCL 1 MG PO TABS
2.0000 mg | ORAL_TABLET | Freq: Every day | ORAL | Status: DC
  Administered 2023-03-08: 2 mg via ORAL
  Filled 2023-03-08: qty 2

## 2023-03-08 MED ORDER — VALSARTAN-HYDROCHLOROTHIAZIDE 160-25 MG PO TABS: 1.0000 | ORAL_TABLET | Freq: Every day | ORAL | Status: DC

## 2023-03-08 MED ORDER — MIDAZOLAM HCL 2 MG/2ML IJ SOLN
INTRAMUSCULAR | Status: DC | PRN
  Administered 2023-03-08: 2 mg via INTRAVENOUS

## 2023-03-08 MED ORDER — CEFAZOLIN SODIUM-DEXTROSE 2-4 GM/100ML-% IV SOLN
2.0000 g | Freq: Four times a day (QID) | INTRAVENOUS | Status: AC
  Administered 2023-03-08 (×2): 2 g via INTRAVENOUS
  Filled 2023-03-08 (×2): qty 100

## 2023-03-08 MED ORDER — POLYETHYLENE GLYCOL 3350 17 G PO PACK
17.0000 g | PACK | Freq: Two times a day (BID) | ORAL | Status: DC
Start: 2023-03-08 — End: 2023-03-09
  Administered 2023-03-08 – 2023-03-09 (×2): 17 g via ORAL
  Filled 2023-03-08 (×2): qty 1

## 2023-03-08 MED ORDER — ONDANSETRON HCL 4 MG/2ML IJ SOLN
INTRAMUSCULAR | Status: DC | PRN
  Administered 2023-03-08: 4 mg via INTRAVENOUS

## 2023-03-08 MED ORDER — METHOCARBAMOL 500 MG PO TABS
500.0000 mg | ORAL_TABLET | Freq: Four times a day (QID) | ORAL | Status: DC | PRN
Start: 2023-03-08 — End: 2023-03-09
  Administered 2023-03-08 – 2023-03-09 (×3): 500 mg via ORAL
  Filled 2023-03-08 (×2): qty 1

## 2023-03-08 MED ORDER — DEXAMETHASONE SODIUM PHOSPHATE 10 MG/ML IJ SOLN
INTRAMUSCULAR | Status: AC
  Filled 2023-03-08: qty 1

## 2023-03-08 MED ORDER — ALBUTEROL SULFATE (2.5 MG/3ML) 0.083% IN NEBU: 3.0000 mL | INHALATION_SOLUTION | RESPIRATORY_TRACT | Status: DC | PRN

## 2023-03-08 MED ORDER — 0.9 % SODIUM CHLORIDE (POUR BTL) OPTIME
TOPICAL | Status: DC | PRN
  Administered 2023-03-08: 1000 mL

## 2023-03-08 MED ORDER — DICYCLOMINE HCL 20 MG PO TABS
20.0000 mg | ORAL_TABLET | Freq: Two times a day (BID) | ORAL | Status: DC
  Administered 2023-03-08 (×2): 20 mg via ORAL
  Filled 2023-03-08 (×3): qty 1

## 2023-03-08 MED ORDER — THROMBIN 5000 UNITS EX SOLR
CUTANEOUS | Status: AC
  Filled 2023-03-08: qty 5000

## 2023-03-08 MED ORDER — DEXMEDETOMIDINE HCL IN NACL 80 MCG/20ML IV SOLN
INTRAVENOUS | Status: DC | PRN
  Administered 2023-03-08: 4 ug via INTRAVENOUS
  Administered 2023-03-08: 8 ug via INTRAVENOUS

## 2023-03-08 MED ORDER — ACETAMINOPHEN 325 MG PO TABS: 650.0000 mg | ORAL_TABLET | ORAL | Status: DC | PRN

## 2023-03-08 MED ORDER — ONDANSETRON HCL 4 MG/2ML IJ SOLN: 4.0000 mg | Freq: Four times a day (QID) | INTRAMUSCULAR | Status: DC | PRN

## 2023-03-08 MED ORDER — CITALOPRAM HYDROBROMIDE 20 MG PO TABS
40.0000 mg | ORAL_TABLET | Freq: Every day | ORAL | Status: DC
  Administered 2023-03-08: 40 mg via ORAL
  Filled 2023-03-08: qty 2

## 2023-03-08 MED ORDER — BUPIVACAINE-EPINEPHRINE (PF) 0.5% -1:200000 IJ SOLN
INTRAMUSCULAR | Status: DC | PRN
  Administered 2023-03-08: 5 mL via PERINEURAL

## 2023-03-08 MED ORDER — LIDOCAINE 2% (20 MG/ML) 5 ML SYRINGE
INTRAMUSCULAR | Status: DC | PRN
  Administered 2023-03-08: 60 mg via INTRAVENOUS

## 2023-03-08 MED ORDER — ACETAMINOPHEN 650 MG RE SUPP: 650.0000 mg | RECTAL | Status: DC | PRN

## 2023-03-08 MED ORDER — ACETAMINOPHEN 500 MG PO TABS: 1000.0000 mg | ORAL_TABLET | Freq: Once | ORAL | Status: DC

## 2023-03-08 MED ORDER — SENNA 8.6 MG PO TABS
1.0000 | ORAL_TABLET | Freq: Two times a day (BID) | ORAL | Status: DC
  Administered 2023-03-08 – 2023-03-09 (×3): 8.6 mg via ORAL
  Filled 2023-03-08 (×4): qty 1

## 2023-03-08 MED ORDER — PREGABALIN 75 MG PO CAPS
150.0000 mg | ORAL_CAPSULE | Freq: Three times a day (TID) | ORAL | Status: DC
  Administered 2023-03-08 (×2): 150 mg via ORAL
  Filled 2023-03-08 (×2): qty 2

## 2023-03-08 MED ORDER — HYDROCHLOROTHIAZIDE 25 MG PO TABS
25.0000 mg | ORAL_TABLET | Freq: Every day | ORAL | Status: DC
  Administered 2023-03-08 – 2023-03-09 (×2): 25 mg via ORAL
  Filled 2023-03-08 (×2): qty 1

## 2023-03-08 MED ORDER — SODIUM CHLORIDE 0.9 % IV SOLN
250.0000 mL | INTRAVENOUS | Status: DC
  Administered 2023-03-08: 250 mL via INTRAVENOUS

## 2023-03-08 MED ORDER — THROMBIN 20000 UNITS EX SOLR
CUTANEOUS | Status: AC
  Filled 2023-03-08: qty 20000

## 2023-03-08 MED ORDER — FLEET ENEMA RE ENEM: 1.0000 | ENEMA | Freq: Once | RECTAL | Status: DC | PRN

## 2023-03-08 MED ORDER — BACITRACIN 500 UNIT/GM EX OINT
TOPICAL_OINTMENT | CUTANEOUS | Status: DC | PRN
  Administered 2023-03-08: 1 via TOPICAL

## 2023-03-08 MED ORDER — IRBESARTAN 150 MG PO TABS
150.0000 mg | ORAL_TABLET | Freq: Every day | ORAL | Status: DC
  Administered 2023-03-08: 150 mg via ORAL
  Filled 2023-03-08 (×2): qty 1

## 2023-03-08 MED ORDER — HYDROMORPHONE HCL 1 MG/ML IJ SOLN
1.0000 mg | INTRAMUSCULAR | Status: DC | PRN
  Administered 2023-03-08: 1 mg via INTRAVENOUS
  Filled 2023-03-08: qty 1

## 2023-03-08 MED ORDER — POLYETHYLENE GLYCOL 3350 17 GM/SCOOP PO POWD
17.0000 g | Freq: Two times a day (BID) | ORAL | Status: DC
  Filled 2023-03-08: qty 255

## 2023-03-08 MED ORDER — MIDAZOLAM HCL 2 MG/2ML IJ SOLN
INTRAMUSCULAR | Status: AC
  Filled 2023-03-08: qty 2

## 2023-03-08 MED ORDER — METHOCARBAMOL 1000 MG/10ML IJ SOLN: 500.0000 mg | Freq: Four times a day (QID) | INTRAMUSCULAR | Status: DC | PRN

## 2023-03-08 MED ORDER — PROPOFOL 10 MG/ML IV BOLUS
INTRAVENOUS | Status: DC | PRN
  Administered 2023-03-08: 200 mg via INTRAVENOUS

## 2023-03-08 MED ORDER — LIDOCAINE-EPINEPHRINE 1 %-1:100000 IJ SOLN
INTRAMUSCULAR | Status: AC
  Filled 2023-03-08: qty 1

## 2023-03-08 MED ORDER — OXYCODONE HCL 5 MG/5ML PO SOLN: 5.0000 mg | Freq: Once | ORAL | Status: AC | PRN

## 2023-03-08 MED ORDER — FENTANYL CITRATE (PF) 100 MCG/2ML IJ SOLN
25.0000 ug | INTRAMUSCULAR | Status: DC | PRN
Start: 2023-03-08 — End: 2023-03-08
  Administered 2023-03-08 (×3): 50 ug via INTRAVENOUS

## 2023-03-08 MED ORDER — LACTATED RINGERS IV SOLN: INTRAVENOUS | Status: DC

## 2023-03-08 MED ORDER — TRAZODONE HCL 100 MG PO TABS: 100.0000 mg | ORAL_TABLET | Freq: Every evening | ORAL | Status: DC | PRN

## 2023-03-08 MED ORDER — DOXYCYCLINE HYCLATE 100 MG PO TABS
100.0000 mg | ORAL_TABLET | Freq: Two times a day (BID) | ORAL | Status: DC
  Administered 2023-03-08 – 2023-03-09 (×3): 100 mg via ORAL
  Filled 2023-03-08 (×4): qty 1

## 2023-03-08 MED ORDER — LACTATED RINGERS IV SOLN: INTRAVENOUS | Status: DC | PRN

## 2023-03-08 MED ORDER — KETAMINE HCL 10 MG/ML IJ SOLN
INTRAMUSCULAR | Status: DC | PRN
  Administered 2023-03-08: 50 mg via INTRAVENOUS

## 2023-03-08 MED ORDER — ONDANSETRON HCL 4 MG/2ML IJ SOLN
INTRAMUSCULAR | Status: AC
  Filled 2023-03-08: qty 2

## 2023-03-08 MED ORDER — EPHEDRINE SULFATE-NACL 50-0.9 MG/10ML-% IV SOSY
PREFILLED_SYRINGE | INTRAVENOUS | Status: DC | PRN
  Administered 2023-03-08: 15 mg via INTRAVENOUS

## 2023-03-08 MED ORDER — PROPOFOL 10 MG/ML IV BOLUS
INTRAVENOUS | Status: AC
  Filled 2023-03-08: qty 20

## 2023-03-08 MED ORDER — EPHEDRINE 5 MG/ML INJ
INTRAVENOUS | Status: AC
  Filled 2023-03-08: qty 5

## 2023-03-08 SURGICAL SUPPLY — 66 items
BAG COUNTER SPONGE SURGICOUNT (BAG) ×2 IMPLANT
BAND RUBBER #18 3X1/16 STRL (MISCELLANEOUS) ×4 IMPLANT
BIT DRILL NEURO 2X3.1 SFT TUCH (MISCELLANEOUS) ×2 IMPLANT
BNDG GAUZE DERMACEA FLUFF 4 (GAUZE/BANDAGES/DRESSINGS) IMPLANT
BUR MATCHSTICK NEURO 3.0 LAGG (BURR) IMPLANT
BUR SABER NEURO 2.5 (BURR) IMPLANT
CNTNR URN SCR LID CUP LEK RST (MISCELLANEOUS) ×2 IMPLANT
COVER BACK TABLE 60X90IN (DRAPES) ×2 IMPLANT
COVER MAYO STAND STRL (DRAPES) ×2 IMPLANT
DERMABOND ADVANCED .7 DNX12 (GAUZE/BANDAGES/DRESSINGS) ×2 IMPLANT
DRAIN JACKSON RD 7FR 3/32 (WOUND CARE) IMPLANT
DRAPE C-ARM 42X72 X-RAY (DRAPES) ×2 IMPLANT
DRAPE LAPAROTOMY 100X72X124 (DRAPES) ×2 IMPLANT
DRAPE MICROSCOPE SLANT 54X150 (MISCELLANEOUS) ×2 IMPLANT
DRAPE SURG 17X23 STRL (DRAPES) ×2 IMPLANT
DRILL NEURO 2X3.1 SOFT TOUCH (MISCELLANEOUS) ×1
DRSG OPSITE POSTOP 4X6 (GAUZE/BANDAGES/DRESSINGS) ×2 IMPLANT
DURAPREP 26ML APPLICATOR (WOUND CARE) ×2 IMPLANT
ELECT BLADE INSULATED 4IN (ELECTROSURGICAL)
ELECT BLADE INSULATED 6.5IN (ELECTROSURGICAL)
ELECT COATED BLADE 2.86 ST (ELECTRODE) ×2 IMPLANT
ELECT REM PT RETURN 9FT ADLT (ELECTROSURGICAL) ×1
ELECTRODE BLADE INSULATED 4IN (ELECTROSURGICAL) IMPLANT
ELECTRODE BLDE INSULATED 6.5IN (ELECTROSURGICAL) IMPLANT
ELECTRODE REM PT RTRN 9FT ADLT (ELECTROSURGICAL) ×2 IMPLANT
GAUZE 4X4 16PLY ~~LOC~~+RFID DBL (SPONGE) IMPLANT
GAUZE SPONGE 4X4 12PLY STRL (GAUZE/BANDAGES/DRESSINGS) ×2 IMPLANT
GLOVE BIO SURGEON STRL SZ7 (GLOVE) ×2 IMPLANT
GLOVE BIOGEL PI IND STRL 7.5 (GLOVE) ×2 IMPLANT
GLOVE BIOGEL PI IND STRL 8 (GLOVE) ×2 IMPLANT
GLOVE ECLIPSE 8.0 STRL XLNG CF (GLOVE) ×2 IMPLANT
GOWN STRL REUS W/ TWL LRG LVL3 (GOWN DISPOSABLE) IMPLANT
GOWN STRL REUS W/ TWL XL LVL3 (GOWN DISPOSABLE) ×4 IMPLANT
GOWN STRL REUS W/TWL 2XL LVL3 (GOWN DISPOSABLE) IMPLANT
HEMOSTAT POWDER KIT SURGIFOAM (HEMOSTASIS) ×2 IMPLANT
KIT BASIN OR (CUSTOM PROCEDURE TRAY) ×2 IMPLANT
KIT TURNOVER KIT B (KITS) ×2 IMPLANT
MARKER SKIN DUAL TIP RULER LAB (MISCELLANEOUS) ×2 IMPLANT
NDL BLUNT 18X1 FOR OR ONLY (NEEDLE) IMPLANT
NDL HYPO 25X1 1.5 SAFETY (NEEDLE) ×2 IMPLANT
NDL SPNL 18GX3.5 QUINCKE PK (NEEDLE) ×4 IMPLANT
NEEDLE BLUNT 18X1 FOR OR ONLY (NEEDLE)
NEEDLE HYPO 25X1 1.5 SAFETY (NEEDLE) ×1
NEEDLE SPNL 18GX3.5 QUINCKE PK (NEEDLE) ×2
NS IRRIG 1000ML POUR BTL (IV SOLUTION) ×2 IMPLANT
PACK LAMINECTOMY NEURO (CUSTOM PROCEDURE TRAY) ×2 IMPLANT
PAD ARMBOARD 7.5X6 YLW CONV (MISCELLANEOUS) ×6 IMPLANT
PATTIES SURGICAL .5 X1 (DISPOSABLE) IMPLANT
PUTTY DBF 3CC CORTICAL FIBERS (Putty) IMPLANT
ROD PRE-CUT 3.5X30 (Rod) IMPLANT
ROD PRECUT 3.5X50 (Rod) IMPLANT
SCREW MULTI AXIAL 3.5X14MM (Screw) IMPLANT
SCREW MULTI AXIAL 4.0X14 (Screw) IMPLANT
SET SCREW INFINITY IFIX THOR (Screw) IMPLANT
SPIKE FLUID TRANSFER (MISCELLANEOUS) ×2 IMPLANT
SPONGE SURGIFOAM ABS GEL 100 (HEMOSTASIS) ×2 IMPLANT
SPONGE T-LAP 4X18 ~~LOC~~+RFID (SPONGE) IMPLANT
STAPLER VISISTAT (STAPLE) IMPLANT
SUT VIC AB 1 CT1 18XBRD ANBCTR (SUTURE) ×2 IMPLANT
SUT VIC AB 2-0 CP2 18 (SUTURE) ×2 IMPLANT
SUT VIC AB 3-0 SH 8-18 (SUTURE) ×2 IMPLANT
SYR 3ML LL SCALE MARK (SYRINGE) ×2 IMPLANT
TOWEL GREEN STERILE (TOWEL DISPOSABLE) ×2 IMPLANT
TOWEL GREEN STERILE FF (TOWEL DISPOSABLE) ×2 IMPLANT
TRAY FOLEY MTR SLVR 16FR STAT (SET/KITS/TRAYS/PACK) ×2 IMPLANT
WATER STERILE IRR 1000ML POUR (IV SOLUTION) ×2 IMPLANT

## 2023-03-08 NOTE — H&P (Addendum)
 Providing Compassionate, Quality Care - Together  NEUROSURGERY HISTORY & PHYSICAL   [name] is an 63 y.o. male.   Chief Complaint: Numbness and tingling in upper and lower HPI: This is a 63 year old male with a history of C4-6 anterior cervical fusions, with complaints of worsening numbness tingling and lightening type sensation shooting into his arms and legs.  He has had difficulty with imbalance and walking.  Workup revealed severe spondylosis, stenosis at C3-4 with cord compression and therefore presents today for surgical intervention.  Given his progressive symptoms, we recommended surgical intervention rather than continued conservative measures.  Past Medical History:  Diagnosis Date   Arthritis    COPD (chronic obstructive pulmonary disease) (HCC)    Depression    Hepatitis C    Hypertension    Pulmonary nodules    Sleep apnea     Past Surgical History:  Procedure Laterality Date   BRONCHIAL BIOPSY  10/03/2022   Procedure: BRONCHIAL BIOPSIES;  Surgeon: Brenna Adine LITTIE, DO;  Location: [facility];  Service: Pulmonary;;   BRONCHIAL NEEDLE ASPIRATION BIOPSY  10/03/2022   Procedure: BRONCHIAL NEEDLE ASPIRATION BIOPSIES;  Surgeon: Brenna Adine LITTIE, DO;  Location: [facility];  Service: Pulmonary;;   BRONCHIAL WASHINGS  10/03/2022   Procedure: BRONCHIAL WASHINGS;  Surgeon: Brenna Adine LITTIE, DO;  Location: [facility];  Service: Pulmonary;;   CERVICAL SPINE SURGERY     Partial left lung resection Left    TEE WITHOUT CARDIOVERSION N/A 09/19/2022   Procedure: TRANSESOPHAGEAL ECHOCARDIOGRAM;  Surgeon: Raford Riggs, MD;  Location: [facility] INVASIVE CV LAB;  Service: Cardiovascular;  Laterality: N/A;    Family History  Problem Relation Age of Onset   Lung cancer Sister    Lung cancer Brother    Social History:  reports that he has been smoking cigarettes. He started smoking about 46 years ago. He has a 69.1 pack-year smoking history. He has never used smokeless tobacco. He  reports current alcohol use. He reports current drug use. Drug: Marijuana.  Allergies:  Allergies  Allergen Reactions   Diovan  Hct [Valsartan -Hydrochlorothiazide ] Other (See Comments)    Hypotension  Dizziness   Patient states he doesn't think he is allergic and is also taking this med.   Norvasc  [Amlodipine ] Other (See Comments)    Hypotension Dizziness   Pt also states not allergic to this medication.    Medications Prior to Admission  Medication Sig Dispense Refill   albuterol  (VENTOLIN  HFA) 108 (90 Base) MCG/ACT inhaler Inhale 1 puff into the lungs every 4 (four) hours as needed for wheezing or shortness of breath.     amitriptyline  (ELAVIL ) 75 MG tablet Take 75 mg by mouth at bedtime.     amLODipine  (NORVASC ) 5 MG tablet Take 5 mg by mouth 2 (two) times daily.     citalopram  (CELEXA ) 40 MG tablet Take 40 mg by mouth at bedtime.     dicyclomine  (BENTYL ) 20 MG tablet Take 1 tablet (20 mg total) by mouth 2 (two) times daily. 20 tablet 0   doxycycline  (VIBRA -TABS) 100 MG tablet Take 1 tablet (100 mg total) by mouth 2 (two) times daily. 11 tablet 0   omeprazole (PRILOSEC) 40 MG capsule TAKE 1 CAPSULE BY MOUTH TWICE DAILY. TAKE FIRST DOSE 30 MINS PRIOR TO MORNINGMEAL.     Oxycodone  HCl 10 MG TABS Take 10 mg by mouth 4 (four) times daily as needed.     pregabalin  (LYRICA ) 150 MG capsule Take 150 mg by mouth 3 (three) times  daily.     rOPINIRole  (REQUIP ) 2 MG tablet Take 2 mg by mouth at bedtime.     senna (SENOKOT) 8.6 MG TABS tablet Take 1 tablet (8.6 mg total) by mouth 2 (two) times daily. 120 tablet 0   traZODone  (DESYREL ) 100 MG tablet Take 100-200 mg by mouth at bedtime as needed for sleep.     TRELEGY ELLIPTA 100-62.5-25 MCG/ACT AEPB Inhale 1 puff into the lungs daily as needed (shortness of breath).     amoxicillin -clavulanate (AUGMENTIN ) 875-125 MG tablet Take 1 tablet by mouth 2 (two) times daily. (Patient not taking: Reported on 03/06/2023) 11 tablet 0   polyethylene glycol  powder (GLYCOLAX /MIRALAX ) 17 GM/SCOOP powder Take 17 g by mouth 2 (two) times daily. 238 g 0   valsartan -hydrochlorothiazide  (DIOVAN -HCT) 160-25 MG tablet Take 1 tablet by mouth daily.      Results for orders placed or performed during the hospital encounter of 03/06/23 (from the past 48 hours)  Surgical pcr screen     Status: None   Collection Time: 03/06/23  1:38 PM   Specimen: Nasal Mucosa; Nasal Swab  Result Value Ref Range   MRSA, PCR NEGATIVE NEGATIVE   Staphylococcus aureus NEGATIVE NEGATIVE    Comment: (NOTE) The Xpert SA Assay (FDA approved for NASAL specimens in patients 70 years of age and older), is one component of a comprehensive surveillance program. It is not intended to diagnose infection nor to guide or monitor treatment. Performed at [facility], [address], [place], [place] [address]   Comprehensive metabolic panel per protocol     Status: Abnormal   Collection Time: 03/06/23  1:38 PM  Result Value Ref Range   Sodium 140 135 - 145 mmol/L   Potassium 3.9 3.5 - 5.1 mmol/L   Chloride 105 98 - 111 mmol/L   CO2 28 22 - 32 mmol/L   Glucose, Bld 98 70 - 99 mg/dL    Comment: Glucose reference range applies only to samples taken after fasting for at least 8 hours.   BUN 12 8 - 23 mg/dL   Creatinine, Ser 9.02 0.61 - 1.24 mg/dL   Calcium 8.9 8.9 - 89.6 mg/dL   Total Protein 5.9 (L) 6.5 - 8.1 g/dL   Albumin 3.2 (L) 3.5 - 5.0 g/dL   AST 19 15 - 41 U/L   ALT 13 0 - 44 U/L   Alkaline Phosphatase 51 38 - 126 U/L   Total Bilirubin 0.5 0.0 - 1.2 mg/dL   GFR, Estimated >39 >39 mL/min    Comment: (NOTE) Calculated using the CKD-EPI Creatinine Equation (2021)    Anion gap 7 5 - 15    Comment: Performed at [facility], [address], [place], [place] [address]  CBC per protocol     Status: None   Collection Time: 03/06/23  1:38 PM  Result Value Ref Range   WBC 7.3 4.0 - 10.5 K/uL   RBC 4.71 4.22 - 5.81 MIL/uL   Hemoglobin 14.0 13.0 - 17.0 g/dL   HCT  56.7 60.9 - 47.9 %   MCV 91.7 80.0 - 100.0 fL   MCH 29.7 26.0 - 34.0 pg   MCHC 32.4 30.0 - 36.0 g/dL   RDW 85.3 88.4 - 84.4 %   Platelets 216 150 - 400 K/uL   nRBC 0.0 0.0 - 0.2 %    Comment: Performed at [facility], [address], [place], [place] [address]  Type and screen  Status: None   Collection Time: 03/06/23  2:05 PM  Result Value Ref Range   ABO/RH(D) A NEG    Antibody Screen NEG    Sample Expiration 03/20/2023,2359    Extend sample reason      NO TRANSFUSIONS OR PREGNANCY IN THE PAST 3 MONTHS Performed at [facility], [address] [place], [place] [address]    No results found.  ROS All pertinent positives and negatives are listed in HPI above  Blood pressure (!) 155/95, pulse 64, temperature 98.2 F (36.8 C), temperature source Oral, resp. rate 18, height 5' 11 (1.803 m), weight 111.6 kg, SpO2 98%. 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  Informed consent was obtained and witnessed.  Clearance was obtained, he was classified as high risk due to his pulmonary status.  We extensively went over risks of anesthesia which he would still like to proceed.  Thank you for allowing me to participate in this patient's care.  Please do not hesitate to call with questions or concerns.   Lani Meadows, DO Neurosurgeon [facility] [phone]

## 2023-03-08 NOTE — Anesthesia Procedure Notes (Signed)
 Procedure Name: Intubation Date/Time: 03/08/2023 10:20 AM  Performed by: Genny Gun, CRNAPre-anesthesia Checklist: Patient identified, Emergency Drugs available, Suction available, Patient being monitored and Timeout performed Patient Re-evaluated:Patient Re-evaluated prior to induction Oxygen Delivery Method: Circle system utilized Preoxygenation: Pre-oxygenation with 100% oxygen Induction Type: IV induction Ventilation: Mask ventilation without difficulty and Oral airway inserted - appropriate to patient size Laryngoscope Size: Glidescope and 4 Grade View: Grade I Tube type: Oral Tube size: 7.5 mm Number of attempts: 1 Placement Confirmation: ETT inserted through vocal cords under direct vision, positive ETCO2 and breath sounds checked- equal and bilateral Secured at: 23 cm Tube secured with: Tape Dental Injury: Teeth and Oropharynx as per pre-operative assessment

## 2023-03-08 NOTE — Progress Notes (Signed)
 Orthopedic Tech Progress Note Patient Details:  [name] 1960/02/24 [id number] Patient was delivered aspen collar from hanger  Patient ID: [name], male   DOB: 1961-01-02, 63 y.o.   MRN: [id number]  [name] 03/08/2023, 3:45 PM

## 2023-03-08 NOTE — Op Note (Signed)
 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 Lani C. Jaydan Meidinger, DO  ASSISTANT: Camie Pickle, PA  ANESTHESIA: General Endotracheal  EBL: 50 cc  SPECIMENS: None  DRAINS: None  COMPLICATIONS: None  CONDITION: Hemodynamically stable  HISTORY: [name] is a 63 y.o. male with a history of multilevel anterior cervical fusion C4-C6, with chronic nonunion and broken hardware at C5-6.  He complained of progressive numbness and tingling in his arms and legs as well as episodes of shooting electrical type pain with extension of his neck.  Cervical x-rays revealed retrolisthesis of C3 on C4 with degenerative spondylosis, nonunion at C5-6 with broken screw.  His prior anterior cervical fusion rendered him with difficulty with dysphonia therefore I recommended a posterior cervical decompression C3-4, lateral mass instrumentation and fusion C3-6 for treatment of his cervical stenosis, myelopathy and nonunion.  We discussed all risks, benefits and expected outcomes as well as alternatives to treatment.  Informed consent was obtained and witnessed.  PROCEDURE IN DETAIL: The patient was brought to the operating room. After induction of general anesthesia, his head was placed in a head holder, the patient was positioned on the operative table in the prone position. All  pressure points were meticulously padded. Skin incision was then marked out and prepped and draped in the usual sterile fashion. Physician driven timeout was performed.  Local anesthetic was injected into the planned incision.  Using 10 blade, incision was made sharply over the C3-C6 minus processes.  Sharp dissection was performed to the dorsal fascia.  Using Bovie electrocautery, subperiosteal dissection was performed bilaterally to expose the C3 lamina, C3-4 facets, C4 lamina, C4 facet, C5 lamina and facet, C6 lamina and facet.  Lateral fluoroscopy confirmed the appropriate levels.  Using Leksell rongeur, I pulled on the C4-5 spinous processes and the C4-5 facet joints did not appear mobile, however the C5-6 joints did definitely appear mobile consistent with nonunion.  Microscope was sterilely draped and brought into the field for the remainder the procedure.  Using high-speed drill, bilateral troughs were created in the C3 and C4 lamina down to the ligamentum flavum and epidural space.  Using Kerrison rongeurs, ligamentum was detached bilaterally and the lamina was carefully elevated with curettes and removed.  This was used for autograft later.  The remainder of the lateral gutter at C3 and C4 was decompressed with Kerrison rongeurs.  The epidural space was identified and the thecal sac was noted to be excellently pulsatile and the epidural space was hemostatic.  We then turned our attention to placement of the lateral mass screws.  Using high-speed drill, I created a pilot hole in the C3, C4, C5 and C6 lateral masses.  Using 3.5 mm tap, to a depth of 14 mm and a superior lateral trajectory I created trajectories for the screws.  Using ball-tipped probe, there were bony borders in all directions and a bony floor.  The above listed screws were placed bilaterally  at C3 with appropriate bony purchase.  This was repeated at C4, C5 and C6.  On the right at C6, the screw fractured out laterally and therefore  using the high-speed drill I created a more medial and superior trajectory.  3.5 mm tap again was used in a more superior trajectory.  I attempted to place then a 4.0 millimeter screw however this again broke out laterally and therefore the screw was left out.  Still high-speed drill, the C3-4, C4-5 and C5-6 joints were decorticated.  Appropriate size rods were then measured, contoured and placed bilaterally.  Setscrews were placed and final tightened to the manufacturer's recommendation.  A mixture of autograft and allograft was placed in the lateral gutters bilaterally from C3-C6.  Deep retractors taken out of the wound.  The epidural space was covered with Gelfoam.  Soft tissue hemostasis was confirmed with monopolar cautery.  Long-acting anesthetic was injected into the soft tissues.  The wound was then closed in layers, muscle and fascia with 1 Vicryl suture.  Dermis was closed with 2-0 and 3-0 Vicryl suture.  Skin was closed with skin glue.  Sterile dressing was applied.  At the end of the case all sponge, needle, and instrument counts were correct. The patient was then transferred to the stretcher, head holder removed, extubated, and taken to the post-anesthesia care unit in stable hemodynamic condition.

## 2023-03-08 NOTE — Transfer of Care (Signed)
 Immediate Anesthesia Transfer of Care Note  Patient: [name]  Procedure(s) Performed: POSTERIOR CERVICAL LAMINECTOMY CERVICAL THREE-FOUR, POSTERIOR CERVICAL FUSION AND INSTRUMENTATION CERVICAL THREE-SIX (Spine Cervical)  Patient Location: PACU  Anesthesia Type:General  Level of Consciousness: awake, oriented, drowsy, and patient cooperative  Airway & Oxygen Therapy: Patient Spontanous Breathing  Post-op Assessment: Report given to RN and Post -op Vital signs reviewed and stable  Post vital signs: Reviewed and stable  Last Vitals:  Vitals Value Taken Time  BP 138/77 03/08/23 1255  Temp 36.7 C 03/08/23 1255  Pulse 78 03/08/23 1259  Resp 21 03/08/23 1259  SpO2 97 % 03/08/23 1259  Vitals shown include unfiled device data.  Last Pain:  Vitals:   03/08/23 0826  TempSrc:   PainSc: 7          Complications: No notable events documented.

## 2023-03-08 NOTE — Anesthesia Postprocedure Evaluation (Signed)
 Anesthesia Post Note  Patient: [name]  Procedure(s) Performed: POSTERIOR CERVICAL LAMINECTOMY CERVICAL THREE-FOUR, POSTERIOR CERVICAL FUSION AND INSTRUMENTATION CERVICAL THREE-SIX (Spine Cervical)     Patient location during evaluation: PACU Anesthesia Type: General Level of consciousness: awake and alert Pain management: pain level controlled Vital Signs Assessment: post-procedure vital signs reviewed and stable Respiratory status: spontaneous breathing, nonlabored ventilation and respiratory function stable Cardiovascular status: stable and blood pressure returned to baseline Anesthetic complications: no   No notable events documented.  Last Vitals:  Vitals:   03/08/23 1315 03/08/23 1330  BP: 131/80 (!) 144/83  Pulse: (!) 59 (!) 59  Resp: 11 11  Temp:    SpO2: 94% 93%                    Debby FORBES Like

## 2023-03-09 MED ORDER — METHOCARBAMOL 500 MG PO TABS: 500.0000 mg | ORAL_TABLET | Freq: Four times a day (QID) | ORAL | 2 refills | Status: AC | PRN

## 2023-03-09 MED ORDER — OXYCODONE HCL 10 MG PO TABS: 10.0000 mg | ORAL_TABLET | ORAL | 0 refills | Status: AC | PRN

## 2023-03-09 NOTE — Discharge Summary (Signed)
 Physician Discharge Summary  Patient ID: [name] MRN: [id number] DOB/AGE: [birth year] 61 y.o.  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 male underwent an elective posterior cervical C3-6 instrumentation and fusion, C3-4 decompression.  He tolerated surgery well, noted improvement in his preoperative neurologic symptoms.  He was ambulating independently, his pain was controlled on oral medication.  He was having normal bowel bladder function and tolerating normal diet.  His wound was clean dry and intact.  Treatments: Surgery -posterior cervical laminectomy C3-4, instrumentation and fusion C3-6  Discharge Exam: Blood pressure 107/68, pulse 61, temperature (!) 97.5 F (36.4 C), temperature source Oral, resp. rate 20, height 5' 11 (1.803 m), weight 111.6 kg, SpO2 98%. Awake, alert, oriented x 3 Speech fluent, appropriate CN grossly intact 5/5 BUE/BLE Wound c/d/I Collar in place  Disposition: Discharge disposition: 01-Home or Self Care       Discharge Instructions     Incentive spirometry RT   Complete by: As directed       Allergies as of 03/09/2023       Reactions   Diovan  Hct [valsartan -hydrochlorothiazide ] Other (See Comments)   Hypotension  Dizziness  Patient states he doesn't think he is allergic and is also taking this med.   Norvasc  [amlodipine ] Other (See Comments)   Hypotension Dizziness  Pt also states not allergic to this medication.        Medication List     TAKE these medications    albuterol  108 (90 Base) MCG/ACT inhaler Commonly known as: VENTOLIN  HFA Inhale 1 puff into the lungs every 4 (four) hours as needed for wheezing or shortness of breath.   amitriptyline  75 MG tablet Commonly known as:  ELAVIL  Take 75 mg by mouth at bedtime.   amLODipine  5 MG tablet Commonly known as: NORVASC  Take 5 mg by mouth 2 (two) times daily.   amoxicillin -clavulanate 875-125 MG tablet Commonly known as: AUGMENTIN  Take 1 tablet by mouth 2 (two) times daily.   citalopram  40 MG tablet Commonly known as: CELEXA  Take 40 mg by mouth at bedtime.   dicyclomine  20 MG tablet Commonly known as: BENTYL  Take 1 tablet (20 mg total) by mouth 2 (two) times daily.   doxycycline  100 MG tablet Commonly known as: VIBRA -TABS Take 1 tablet (100 mg total) by mouth 2 (two) times daily.   methocarbamol  500 MG tablet Commonly known as: ROBAXIN  Take 1 tablet (500 mg total) by mouth every 6 (six) hours as needed for muscle spasms.   omeprazole 40 MG capsule Commonly known as: PRILOSEC TAKE 1 CAPSULE BY MOUTH TWICE DAILY. TAKE FIRST DOSE 30 MINS PRIOR TO MORNINGMEAL.   Oxycodone  HCl 10 MG Tabs Take 1-2 tablets (10-20 mg total) by mouth every 4 (four) hours as needed for moderate pain (pain score 4-6) or severe pain (pain score 7-10). What changed:  how much to take when to take this reasons to take this   polyethylene glycol powder 17 GM/SCOOP powder Commonly known as: GLYCOLAX /MIRALAX  Take 17 g by mouth 2 (two) times daily.   pregabalin  150 MG capsule Commonly known as: LYRICA  Take 150 mg by mouth 3 (three) times daily.   rOPINIRole  2 MG tablet Commonly known as: REQUIP  Take 2 mg by mouth at bedtime.   senna 8.6 MG Tabs tablet  Commonly known as: SENOKOT Take 1 tablet (8.6 mg total) by mouth 2 (two) times daily.   traZODone  100 MG tablet Commonly known as: DESYREL  Take 100-200 mg by mouth at bedtime as needed for sleep.   Trelegy Ellipta 100-62.5-25 MCG/ACT Aepb Generic drug: Fluticasone -Umeclidin-Vilant Inhale 1 puff into the lungs daily as needed (shortness of breath).   valsartan -hydrochlorothiazide  160-25 MG tablet Commonly known as: DIOVAN -HCT Take 1 tablet by mouth daily.          SignedBETHA Lani BROCKS [name] 03/09/2023, 8:16 AM

## 2023-03-09 NOTE — Evaluation (Signed)
 Occupational Therapy Evaluation Patient Details Name: [name] MRN: [id number] DOB: 08-Apr-1960 Today's Date: 03/09/2023   History of Present Illness [name] is a 63 yo male who underwent posterior cervical laminectomy C3-4, posterior cervical fusion C3-6 2/6. PMHx includes arthritis, COPD, hypertension, pulmonary nodules, depression, and Hepatitis C.   Clinical Impression   [name] was evaluated s/p the above spine surgery. He is indep without DME at baseline, he plans to d/c to a friends house for increased support. Upon evaluation pt was limited by surgical pain, cervical precautions, limited activity tolerance and knowledge of compensatory techniques. Overall he demonstrated mod I ability to complete ADLs and functional mobility with mod I and use of RW. Provided cues and education on spinal precautions and compensatory techniques throughout, handout provided and pt demonstrated good recall during ADLs and mobility. Pt does not require further acute OT services. Recommend d/c home with support of family.         If plan is discharge home, recommend the following: Assistance with cooking/housework;Assist for transportation    Functional Status Assessment  Patient has had a recent decline in their functional status and demonstrates the ability to make significant improvements in function in a reasonable and predictable amount of time.  Equipment Recommendations  Other (comment) (RW)       Precautions / Restrictions Precautions Precautions: Fall;Cervical Precaution Booklet Issued: Yes (comment) Required Braces or Orthoses: Cervical Brace Restrictions Weight Bearing Restrictions Per Provider Order: No      Mobility Bed Mobility Overal bed mobility: Needs Assistance Bed Mobility: Rolling, Sidelying to Sit Rolling: Supervision Sidelying to sit: Supervision       General bed mobility comments: cue for log roll    Transfers Overall transfer level: Needs assistance    Transfers: Sit to/from Stand Sit to Stand: Modified independent (Device/Increase time)                  Balance Overall balance assessment: No apparent balance deficits (not formally assessed)                 ADL either performed or assessed with clinical judgement   ADL Overall ADL's : Modified independent             General ADL Comments: pt demonstrated mod I ability to complete ADLs after review of cervical precautions and compensatory technqiues     Vision Baseline Vision/History: 0 No visual deficits Vision Assessment?: No apparent visual deficits     Perception Perception: Within Functional Limits       Praxis Praxis: WFL       Pertinent Vitals/Pain Pain Assessment Pain Assessment: Faces Faces Pain Scale: Hurts little more Pain Location: neck Pain Descriptors / Indicators: Discomfort, Grimacing, Guarding Pain Intervention(s): Limited activity within patient's tolerance, Monitored during session     Extremity/Trunk Assessment Upper Extremity Assessment Upper Extremity Assessment: Overall WFL for tasks assessed   Lower Extremity Assessment Lower Extremity Assessment: Defer to PT evaluation   Cervical / Trunk Assessment Cervical / Trunk Assessment: Neck Surgery   Communication Communication Communication: No apparent difficulties   Cognition Arousal: Alert Behavior During Therapy: WFL for tasks assessed/performed Overall Cognitive Status: Within Functional Limits for tasks assessed             General Comments  VSS            Home Living Family/patient expects to be discharged to:: Private residence Living Arrangements: Non-relatives/Friends Available Help at Discharge: Friend(s);Available 24 hours/day Type of Home: House  Home Access: Stairs to enter Entrance Stairs-Number of Steps: 5 Entrance Stairs-Rails: Left;Right Home Layout: One level     Bathroom Shower/Tub: Chief Strategy Officer: Standard      Home Equipment: Shower seat          Prior Functioning/Environment Prior Level of Function : Independent/Modified Independent;Driving             Mobility Comments: no AD ADLs Comments: indep, does not work        OT Problem List: Decreased activity tolerance;Decreased knowledge of precautions         OT Goals(Current goals can be found in the care plan section) Acute Rehab OT Goals Patient Stated Goal: home OT Goal Formulation: With patient Time For Goal Achievement: 03/09/23 Potential to Achieve Goals: Good   AM-PAC OT 6 Clicks Daily Activity     Outcome Measure Help from another person eating meals?: None Help from another person taking care of personal grooming?: None Help from another person toileting, which includes using toliet, bedpan, or urinal?: None Help from another person bathing (including washing, rinsing, drying)?: None Help from another person to put on and taking off regular upper body clothing?: None Help from another person to put on and taking off regular lower body clothing?: None 6 Click Score: 24   End of Session Equipment Utilized During Treatment: Rolling walker (2 wheels);Cervical collar Nurse Communication: Mobility status  Activity Tolerance: Patient tolerated treatment well Patient left: in bed;with call bell/phone within reach  OT Visit Diagnosis: Muscle weakness (generalized) (M62.81);Pain                Time: 9140-9085 OT Time Calculation (min): 15 min Charges:  OT General Charges $OT Visit: 1 Visit OT Evaluation $OT Eval Low Complexity: 1 Low  Lucie Kendall, OTR/L Acute Rehabilitation Services Office [phone] Secure Chat Communication Preferred   Lucie JONETTA Kendall 03/09/2023, 9:18 AM

## 2023-03-09 NOTE — Progress Notes (Signed)
 Patient alert and oriented, void, ambulate. Surgical site clean and dry no sign of infection. D/c instructions explain and given all questions answered.

## 2023-03-09 NOTE — Discharge Instructions (Addendum)
  Wound Care Keep incision covered and dry until post op day 3. You may remove the Honeycomb dressing on post op day 3. Leave steri-strips on neck.  They will fall off by themselves. Do not put any creams, lotions, or ointments on incision. You are fine to shower. Let water run over incision and pat dry.  Activity Walk each and every day, increasing distance each day. No lifting greater than 8 lbs.  Avoid excessive neck motion. No driving, you csn ride as a passenger locally  Diet Resume your normal diet.   Return to Work Will be discussed at your follow up appointment.  Call Your Doctor If Any of These Occur Redness, drainage, or swelling at the wound.  Temperature greater than 101 degrees. Severe pain not relieved by pain medication. Incision starts to come apart.  Follow Up Appt Call [phone] if you have one or any problem.

## 2023-03-09 NOTE — Progress Notes (Signed)
 PT Cancellation Note and Discharge  Patient Details Name: [name] MRN: [id number] DOB: October 16, 1960   Cancelled Treatment:    Reason Eval/Treat Not Completed: PT screened, no needs identified, will sign off. Discussed pt case with OT who reports pt is currently mobilizing at a modified independent level and does not require a formal PT evaluation at this time. PT signing off. If needs change, please reconsult.     [name] 03/09/2023, 9:52 AM  Leita Sable, PT, DPT Acute Rehabilitation Services Secure Chat Preferred Office: [phone]

## 2023-03-14 ENCOUNTER — Encounter (HOSPITAL_COMMUNITY): Payer: Self-pay | Admitting: Neurological Surgery

## 2023-08-01 ENCOUNTER — Encounter (HOSPITAL_BASED_OUTPATIENT_CLINIC_OR_DEPARTMENT_OTHER)

## 2023-08-01 ENCOUNTER — Encounter (HOSPITAL_BASED_OUTPATIENT_CLINIC_OR_DEPARTMENT_OTHER): Payer: Self-pay | Admitting: Pulmonary Disease

## 2024-03-01 ENCOUNTER — Encounter (HOSPITAL_COMMUNITY): Payer: Self-pay | Admitting: *Deleted

## 2024-03-01 ENCOUNTER — Emergency Department (HOSPITAL_COMMUNITY)

## 2024-03-01 ENCOUNTER — Emergency Department (HOSPITAL_COMMUNITY)
Admission: EM | Admit: 2024-03-01 | Discharge: 2024-03-01 | Disposition: A | Discharge status: Home / Self Care | Attending: Emergency Medicine | Admitting: Emergency Medicine

## 2024-03-01 ENCOUNTER — Other Ambulatory Visit: Payer: Self-pay

## 2024-03-01 DIAGNOSIS — R748 Abnormal levels of other serum enzymes: Secondary | ICD-10-CM | POA: Diagnosis present

## 2024-03-01 DIAGNOSIS — R1084 Generalized abdominal pain: Secondary | ICD-10-CM | POA: Diagnosis not present

## 2024-03-01 LAB — COMPREHENSIVE METABOLIC PANEL WITH GFR
ALT: 25 U/L (ref 0–44)
AST: 38 U/L (ref 15–41)
Albumin: 4 g/dL (ref 3.5–5.0)
Alkaline Phosphatase: 65 U/L (ref 38–126)
Anion gap: 11 (ref 5–15)
BUN: 15 mg/dL (ref 8–23)
CO2: 23 mmol/L (ref 22–32)
Calcium: 9 mg/dL (ref 8.9–10.3)
Chloride: 101 mmol/L (ref 98–111)
Creatinine, Ser: 0.93 mg/dL (ref 0.61–1.24)
GFR, Estimated: >60 mL/min
Glucose, Bld: 108 mg/dL — ABNORMAL HIGH (ref 70–99)
Potassium: 3.9 mmol/L (ref 3.5–5.1)
Sodium: 136 mmol/L (ref 135–145)
Total Bilirubin: 0.8 mg/dL (ref 0.0–1.2)
Total Protein: 6.7 g/dL (ref 6.5–8.1)

## 2024-03-01 LAB — URINALYSIS, ROUTINE W REFLEX MICROSCOPIC
Bilirubin Urine: NEGATIVE
Glucose, UA: NEGATIVE mg/dL
Hgb urine dipstick: NEGATIVE
Ketones, ur: NEGATIVE mg/dL
Leukocytes,Ua: NEGATIVE
Nitrite: NEGATIVE
Protein, ur: NEGATIVE mg/dL
Specific Gravity, Urine: 1.016 (ref 1.005–1.030)
pH: 6 (ref 5.0–8.0)

## 2024-03-01 LAB — CBC
HCT: 44 % (ref 39.0–52.0)
Hemoglobin: 15.2 g/dL (ref 13.0–17.0)
MCH: 31.1 pg (ref 26.0–34.0)
MCHC: 34.5 g/dL (ref 30.0–36.0)
MCV: 90.2 fL (ref 80.0–100.0)
Platelets: 205 10*3/uL (ref 150–400)
RBC: 4.88 MIL/uL (ref 4.22–5.81)
RDW: 13.3 % (ref 11.5–15.5)
WBC: 8.4 10*3/uL (ref 4.0–10.5)
nRBC: 0 % (ref 0.0–0.2)

## 2024-03-01 LAB — LIPASE, BLOOD: Lipase: 89 U/L — ABNORMAL HIGH (ref 11–51)

## 2024-03-01 MED ORDER — OXYCODONE HCL 5 MG PO TABS
10.0000 mg | ORAL_TABLET | ORAL | Status: DC | PRN
  Administered 2024-03-01: 10 mg via ORAL
  Filled 2024-03-01: qty 2

## 2024-03-01 NOTE — ED Provider Notes (Signed)
 " [facility] Provider Note   CSN: [id number] Arrival date & time: 03/01/24  1653     Patient presents with: Flank Pain and Hematuria   [name] is a 64 y.o. male presented to ED with 2 days of flank pain.  He says predominantly right-sided but now left-sided.  He has noted pressure with urination and bloody urine.  Also has had loose stools for a few days.  Denies history of kidney stones.   HPI     Prior to Admission medications  Medication Sig Start Date End Date Taking? Authorizing Provider  albuterol  (VENTOLIN  HFA) 108 (90 Base) MCG/ACT inhaler Inhale 1 puff into the lungs every 4 (four) hours as needed for wheezing or shortness of breath.    [provider]  amitriptyline  (ELAVIL ) 75 MG tablet Take 75 mg by mouth at bedtime. 04/25/22   [provider]  amLODipine  (NORVASC ) 5 MG tablet Take 5 mg by mouth 2 (two) times daily.    [provider]  amoxicillin -clavulanate (AUGMENTIN ) 875-125 MG tablet Take 1 tablet by mouth 2 (two) times daily. Patient not taking: Reported on 03/06/2023 09/19/22   Nicholas Bar, MD  citalopram  (CELEXA ) 40 MG tablet Take 40 mg by mouth at bedtime. 01/10/21   [provider]  dicyclomine  (BENTYL ) 20 MG tablet Take 1 tablet (20 mg total) by mouth 2 (two) times daily. 10/29/22   Mannie Fairy DASEN, DO  doxycycline  (VIBRA -TABS) 100 MG tablet Take 1 tablet (100 mg total) by mouth 2 (two) times daily. 09/19/22   Nicholas Bar, MD  methocarbamol  (ROBAXIN ) 500 MG tablet Take 1 tablet (500 mg total) by mouth every 6 (six) hours as needed for muscle spasms. 03/09/23   Dawley, Troy C, DO  omeprazole (PRILOSEC) 40 MG capsule TAKE 1 CAPSULE BY MOUTH TWICE DAILY. TAKE FIRST DOSE 30 MINS PRIOR TO MORNINGMEAL. 12/13/20   [provider]  oxyCODONE  10 MG TABS Take 1-2 tablets (10-20 mg total) by mouth every 4 (four) hours as needed for moderate pain (pain score 4-6) or severe pain  (pain score 7-10). 03/09/23   Dawley, Troy C, DO  polyethylene glycol powder (GLYCOLAX /MIRALAX ) 17 GM/SCOOP powder Take 17 g by mouth 2 (two) times daily. 09/19/22   Nicholas Bar, MD  pregabalin  (LYRICA ) 150 MG capsule Take 150 mg by mouth 3 (three) times daily. 06/27/22   [provider]  rOPINIRole  (REQUIP ) 2 MG tablet Take 2 mg by mouth at bedtime. 04/25/22   [provider]  senna (SENOKOT) 8.6 MG TABS tablet Take 1 tablet (8.6 mg total) by mouth 2 (two) times daily. 09/19/22   Nicholas Bar, MD  traZODone  (DESYREL ) 100 MG tablet Take 100-200 mg by mouth at bedtime as needed for sleep.    [provider]  TRELEGY ELLIPTA 100-62.5-25 MCG/ACT AEPB Inhale 1 puff into the lungs daily as needed (shortness of breath). 06/23/22   [provider]  valsartan -hydrochlorothiazide  (DIOVAN -HCT) 160-25 MG tablet Take 1 tablet by mouth daily.    [provider]    Allergies: Diovan  hct [valsartan -hydrochlorothiazide ] and Norvasc  [amlodipine ]    Review of Systems  Updated Vital Signs BP 139/87   Pulse 90   Temp 98.3 F (36.8 C) (Oral)   Resp 20   Ht 5' 11 (1.803 m)   Wt 111.6 kg   SpO2 100%   BMI 34.31 kg/m   Physical Exam Constitutional:      General: He is not in acute distress.  HENT:     Head: Normocephalic and atraumatic.  Eyes:     Conjunctiva/sclera: Conjunctivae normal.     Pupils: Pupils are equal, round, and reactive to light.  Cardiovascular:     Rate and Rhythm: Normal rate and regular rhythm.  Pulmonary:     Effort: Pulmonary effort is normal. No respiratory distress.  Abdominal:     General: There is no distension.     Tenderness: There is no abdominal tenderness. There is no right CVA tenderness or left CVA tenderness.  Skin:    General: Skin is warm and dry.  Neurological:     General: No focal deficit present.     Mental Status: He is alert and oriented to person, place, and time. Mental status is at baseline.  Psychiatric:         Mood and Affect: Mood normal.        Behavior: Behavior normal.     (all labs ordered are listed, but only abnormal results are displayed) Labs Reviewed  COMPREHENSIVE METABOLIC PANEL WITH GFR - Abnormal; Notable for the following components:      Result Value   Glucose, Bld 108 (*)    All other components within normal limits  LIPASE, BLOOD - Abnormal; Notable for the following components:   Lipase 89 (*)    All other components within normal limits  CBC  URINALYSIS, ROUTINE W REFLEX MICROSCOPIC    EKG: None  Radiology: CT Renal Stone Study Result Date: 03/01/2024 EXAM: CT ABDOMEN AND PELVIS WITHOUT CONTRAST 03/01/2024 06:38:32 PM TECHNIQUE: CT of the abdomen and pelvis was performed without the administration of intravenous contrast. Multiplanar reformatted images are provided for review. Automated exposure control, iterative reconstruction, and/or weight-based adjustment of the mA/kV was utilized to reduce the radiation dose to as low as reasonably achievable. COMPARISON: Comparison with 10/29/2022. CLINICAL HISTORY: Abdominal/flank pain, stone suspected; bilateral flank pain, dysuria, hematuria x 2 days. FINDINGS: LOWER CHEST: No acute abnormality. LIVER: The liver is unremarkable. GALLBLADDER AND BILE DUCTS: Gallbladder is unremarkable. No biliary ductal dilatation. SPLEEN: No acute abnormality. PANCREAS: No acute abnormality. ADRENAL GLANDS: No acute abnormality. KIDNEYS, URETERS AND BLADDER: No stones in the kidneys or ureters. No hydronephrosis. No perinephric or periureteral stranding. Urinary bladder is unremarkable. GI AND BOWEL: Stomach demonstrates no acute abnormality. There is no bowel obstruction. Normal appendix. PERITONEUM AND RETROPERITONEUM: No ascites. No free air. VASCULATURE: Aorta is normal in caliber. Aortic atherosclerotic calcification. LYMPH NODES: No lymphadenopathy. REPRODUCTIVE ORGANS: No acute abnormality. BONES AND SOFT TISSUES: No acute osseous  abnormality. No focal soft tissue abnormality. IMPRESSION: 1. No acute findings in the abdomen or pelvis. Electronically signed by: Norman Gatlin MD 03/01/2024 06:49 PM EST RP Workstation: [id number]     Procedures   Medications Ordered in the ED  oxyCODONE  (Oxy IR/ROXICODONE ) immediate release tablet 10 mg (10 mg Oral Given 03/01/24 1811)                                    Medical Decision Making Amount and/or Complexity of Data Reviewed Labs: ordered. Radiology: ordered.  Risk Prescription drug management.   Patient is here with flank pain, dysuria for 2 to 3 days, also diarrhea.  Differential include ureteral colic versus UTI versus colitis versus other intra-abdominal process  I will order the patient's chronic home pain medicine  CT imaging, UA and labs ordered and personally reviewed and interpreted, notable for no emergent  findings on CT imaging.  Lipase does have some minor elevation.  This may be suggestive of some early developing (or resolving) pancreatitis.  No inflammatory findings on CT imaging, no leukocytosis; the patient is easily tolerating p.o.  I do not think he needs hospitalization for this issue.  He can follow-up with PCP for recheck of his levels in 1 week.  We did discuss possible risk factors.  He does not drink alcohol visually but did have 1 beer earlier this week and does drink on occasion.  We discussed elimination of alcohol from his diet.  Low suspicion for gallstone etiology.  He is not on steroids or any other new medications.  I do think that possible early mild pancreatitis would explain his symptoms including the pain that he is feeling towards the middle of his back, as well as his loose stools.  We discussed staying very hydrated drinking lots of water and also provided the pancreatitis eating plan for home.  But he is comfortable going home and his pain appears under control  Doubt AAA, sepsis, GI perforation, or other medical emergency per  presentation     Final diagnoses:  Elevated lipase  Generalized abdominal pain    ED Discharge Orders     None          Kalianne Fetting, Donnice PARAS, MD 03/01/24 1920  "

## 2024-03-01 NOTE — Discharge Instructions (Addendum)
 Do not drink alcohol in the next 2 weeks and try to avoid or cut out alcohol altogether in the future.  Please drink lots of water over the next few days.  Read the attached instructions about pancreatitis and an eating plan.

## 2024-03-01 NOTE — ED Triage Notes (Signed)
 The pt is c/o  rt sided flank pain  also bloody urine  and diarrhea  for 2-3 days

## 2024-03-04 ENCOUNTER — Other Ambulatory Visit: Payer: Self-pay | Admitting: Surgery
